# Patient Record
Sex: Female | Born: 1960 | Race: Black or African American | Hispanic: No | Smoking: Never smoker
Health system: Southern US, Community
[De-identification: ages and names within clinical notes are randomized; demographics above are authoritative.]

## PROBLEM LIST (undated history)

## (undated) DIAGNOSIS — K219 Gastro-esophageal reflux disease without esophagitis: Secondary | ICD-10-CM

## (undated) DIAGNOSIS — I10 Essential (primary) hypertension: Secondary | ICD-10-CM

## (undated) DIAGNOSIS — J45909 Unspecified asthma, uncomplicated: Secondary | ICD-10-CM

---

## 2007-06-27 ENCOUNTER — Emergency Department: Payer: Self-pay | Admitting: Emergency Medicine

## 2008-12-20 DIAGNOSIS — F411 Generalized anxiety disorder: Secondary | ICD-10-CM | POA: Insufficient documentation

## 2008-12-28 ENCOUNTER — Ambulatory Visit: Payer: Self-pay

## 2009-08-25 DIAGNOSIS — I10 Essential (primary) hypertension: Secondary | ICD-10-CM | POA: Insufficient documentation

## 2011-05-01 ENCOUNTER — Emergency Department: Payer: Self-pay | Admitting: Emergency Medicine

## 2011-08-31 DIAGNOSIS — K219 Gastro-esophageal reflux disease without esophagitis: Secondary | ICD-10-CM | POA: Insufficient documentation

## 2012-05-24 ENCOUNTER — Emergency Department: Payer: Self-pay | Admitting: Emergency Medicine

## 2012-05-24 LAB — COMPREHENSIVE METABOLIC PANEL
Albumin: 4.1 g/dL (ref 3.4–5.0)
Anion Gap: 4 — ABNORMAL LOW (ref 7–16)
BUN: 20 mg/dL — ABNORMAL HIGH (ref 7–18)
Chloride: 106 mmol/L (ref 98–107)
Co2: 29 mmol/L (ref 21–32)
EGFR (African American): 58 — ABNORMAL LOW
Glucose: 98 mg/dL (ref 65–99)
Osmolality: 280 (ref 275–301)
Potassium: 3.3 mmol/L — ABNORMAL LOW (ref 3.5–5.1)
SGOT(AST): 34 U/L (ref 15–37)
SGPT (ALT): 36 U/L (ref 12–78)
Sodium: 139 mmol/L (ref 136–145)
Total Protein: 8.4 g/dL — ABNORMAL HIGH (ref 6.4–8.2)

## 2012-05-24 LAB — CBC
MCHC: 35.7 g/dL (ref 32.0–36.0)
MCV: 91 fL (ref 80–100)
Platelet: 270 10*3/uL (ref 150–440)
RDW: 13.6 % (ref 11.5–14.5)
WBC: 5.5 10*3/uL (ref 3.6–11.0)

## 2012-08-14 ENCOUNTER — Emergency Department: Payer: Self-pay | Admitting: Emergency Medicine

## 2012-08-14 LAB — CBC
HCT: 40.3 % (ref 35.0–47.0)
HGB: 13.3 g/dL (ref 12.0–16.0)
MCH: 30.5 pg (ref 26.0–34.0)
MCHC: 32.8 g/dL (ref 32.0–36.0)
Platelet: 260 10*3/uL (ref 150–440)
RBC: 4.35 10*6/uL (ref 3.80–5.20)
WBC: 4.3 10*3/uL (ref 3.6–11.0)

## 2012-08-14 LAB — BASIC METABOLIC PANEL
Anion Gap: 7 (ref 7–16)
BUN: 23 mg/dL — ABNORMAL HIGH (ref 7–18)
Chloride: 105 mmol/L (ref 98–107)
Co2: 27 mmol/L (ref 21–32)
Creatinine: 1.16 mg/dL (ref 0.60–1.30)
Osmolality: 281 (ref 275–301)
Potassium: 3 mmol/L — ABNORMAL LOW (ref 3.5–5.1)
Sodium: 139 mmol/L (ref 136–145)

## 2012-10-01 ENCOUNTER — Ambulatory Visit: Payer: Self-pay

## 2012-12-22 ENCOUNTER — Ambulatory Visit: Payer: Self-pay

## 2013-01-02 ENCOUNTER — Ambulatory Visit: Payer: Self-pay

## 2013-08-21 ENCOUNTER — Ambulatory Visit: Payer: Self-pay | Admitting: Orthopedic Surgery

## 2013-09-02 ENCOUNTER — Ambulatory Visit: Payer: Self-pay | Admitting: Orthopedic Surgery

## 2014-07-11 ENCOUNTER — Emergency Department: Payer: Self-pay | Admitting: Emergency Medicine

## 2017-06-11 ENCOUNTER — Other Ambulatory Visit: Payer: Self-pay

## 2017-06-11 ENCOUNTER — Emergency Department: Payer: BLUE CROSS/BLUE SHIELD

## 2017-06-11 ENCOUNTER — Emergency Department
Admission: EM | Admit: 2017-06-11 | Discharge: 2017-06-12 | Disposition: A | Discharge status: Home / Self Care | Payer: BLUE CROSS/BLUE SHIELD | Attending: Emergency Medicine | Admitting: Emergency Medicine

## 2017-06-11 DIAGNOSIS — R0602 Shortness of breath: Secondary | ICD-10-CM | POA: Diagnosis not present

## 2017-06-11 DIAGNOSIS — R079 Chest pain, unspecified: Secondary | ICD-10-CM | POA: Diagnosis present

## 2017-06-11 DIAGNOSIS — Z79899 Other long term (current) drug therapy: Secondary | ICD-10-CM | POA: Insufficient documentation

## 2017-06-11 LAB — BASIC METABOLIC PANEL
Anion gap: 13 (ref 5–15)
BUN: 20 mg/dL (ref 6–20)
CHLORIDE: 99 mmol/L — AB (ref 101–111)
CO2: 26 mmol/L (ref 22–32)
Calcium: 9.5 mg/dL (ref 8.9–10.3)
Creatinine, Ser: 0.93 mg/dL (ref 0.44–1.00)
GFR calc Af Amer: >60 mL/min (ref >60)
GFR calc non Af Amer: >60 mL/min (ref >60)
GLUCOSE: 115 mg/dL — AB (ref 65–99)
POTASSIUM: 2.4 mmol/L — AB (ref 3.5–5.1)
Sodium: 138 mmol/L (ref 135–145)

## 2017-06-11 LAB — CBC
HEMATOCRIT: 36.5 % (ref 35.0–47.0)
HEMOGLOBIN: 12.3 g/dL (ref 12.0–16.0)
MCH: 30.5 pg (ref 26.0–34.0)
MCHC: 33.8 g/dL (ref 32.0–36.0)
MCV: 90.2 fL (ref 80.0–100.0)
Platelets: 302 10*3/uL (ref 150–440)
RBC: 4.05 MIL/uL (ref 3.80–5.20)
RDW: 13.3 % (ref 11.5–14.5)
WBC: 5.4 10*3/uL (ref 3.6–11.0)

## 2017-06-11 LAB — TROPONIN I: Troponin I: <0.03 ng/mL (ref <0.03)

## 2017-06-11 NOTE — ED Triage Notes (Signed)
Pt comes via [facility] from home with c/o chest pain. Pt denies vomiting and had some nausea this am. Pt states pressure and pain was 5/10. EMS gave pt 1 nitro and [facility] this [facility], pt pain currently 4/10. Pt hypertensive initially with BP 156/102. CBG-122, Pulse-110-124 per EMS. PT A&OX4. Respirations even and unlabored and appears in NAD.

## 2017-06-11 NOTE — ED Notes (Signed)
Patient transported to X-ray 

## 2017-06-12 LAB — TROPONIN I: Troponin I: <0.03 ng/mL (ref <0.03)

## 2017-06-12 MED ORDER — POTASSIUM CHLORIDE 20 MEQ PO PACK
40.0000 meq | PACK | Freq: Once | ORAL | Status: AC
  Administered 2017-06-12: 40 meq via ORAL

## 2017-06-12 MED ORDER — POTASSIUM CHLORIDE CRYS ER 20 MEQ PO TBCR
40.0000 meq | EXTENDED_RELEASE_TABLET | Freq: Once | ORAL | Status: AC
  Administered 2017-06-12: 40 meq via ORAL
  Filled 2017-06-12: qty 2

## 2017-06-12 MED ORDER — POTASSIUM CHLORIDE 20 MEQ/15ML (10%) PO SOLN
40.0000 meq | Freq: Once | ORAL | Status: DC
Start: 2017-06-12 — End: 2017-06-12
  Filled 2017-06-12: qty 30

## 2017-06-12 MED ORDER — POTASSIUM CHLORIDE 10 MEQ/100ML IV SOLN
10.0000 meq | INTRAVENOUS | Status: AC
  Administered 2017-06-12: 10 meq via INTRAVENOUS
  Filled 2017-06-12 (×4): qty 100

## 2017-06-12 MED ORDER — SODIUM CHLORIDE 0.9 % IV BOLUS (SEPSIS)
500.0000 mL | Freq: Once | INTRAVENOUS | Status: AC
Start: 2017-06-12 — End: 2017-06-12
  Administered 2017-06-12: 500 mL via INTRAVENOUS

## 2017-06-12 MED ORDER — POTASSIUM CHLORIDE 20 MEQ PO PACK
PACK | ORAL | Status: AC
Start: 2017-06-12 — End: 2017-06-12
  Administered 2017-06-12: 40 meq
  Filled 2017-06-12: qty 2

## 2017-06-12 NOTE — ED Provider Notes (Signed)
[facility] Emergency Department Provider Note   ____________________________________________   First MD Initiated Contact with Patient 06/11/17 2305     (approximate)  I have reviewed the triage vital signs and the nursing notes.   HISTORY  Chief Complaint Chest Pain    HPI Liam GrahamSusan Freda is a 56 y.o. female who comes into the hospital today with shortness of breath and chest pain.  The patient states this started around 8:00.  The patient was sitting and watching TV when it started.  She states it was initially on the right side of her chest and then moved across the left side.  She reports that it was tightness in her chest.  The pain went away on the right but now it is on the left side.  The patient states that her pain is a 4 out of 10 in intensity.  She denies any nausea vomiting lightheadedness or dizziness.  She has had some shortness of breath and she denies that she has had this before.  The patient states that the pain is not pleuritic and does not get worse when she takes a deep breath.   History reviewed. No pertinent past medical history.  There are no active problems to display for this patient.   History reviewed. No pertinent surgical history.  Prior to Admission medications   Medication Sig Start Date End Date Taking? Authorizing Provider  esomeprazole (NEXIUM) 40 MG capsule Take 40 mg by mouth daily.   Yes [provider]  fexofenadine (ALLEGRA) 180 MG tablet Take 180 mg by mouth daily.   Yes [provider]  fluticasone (FLONASE) 50 MCG/ACT nasal spray Place 1 spray into both nostrils daily.   Yes [provider]  hydrochlorothiazide (HYDRODIURIL) 25 MG tablet Take 25 mg by mouth daily.   Yes [provider]  zolpidem (AMBIEN) 5 MG tablet Take 5 mg by mouth at bedtime.    [provider]    Allergies Patient has no known allergies.  No family history on file.  Social History Social  History   Tobacco Use  . Smoking status: Never Smoker  . Smokeless tobacco: Never Used  Substance Use Topics  . Alcohol use: No    Frequency: Never  . Drug use: No    Review of Systems  Constitutional: No fever/chills Eyes: No visual changes. ENT: No sore throat. Cardiovascular:  chest pain. Respiratory:  shortness of breath. Gastrointestinal: No abdominal pain.  No nausea, no vomiting.  No diarrhea.  No constipation. Genitourinary: Negative for dysuria. Musculoskeletal: Negative for back pain. Skin: Negative for rash. Neurological: Negative for headaches, focal weakness or numbness.   ____________________________________________   PHYSICAL EXAM:  VITAL SIGNS: ED Triage Vitals  Enc Vitals Group     BP 06/11/17 2314 132/88     Pulse Rate 06/11/17 2314 (!) 101     Resp 06/11/17 2314 16     Temp 06/11/17 2314 (!) 97.5 F (36.4 C)     Temp Source 06/11/17 2314 Oral     SpO2 06/11/17 2314 100 %     Weight 06/11/17 2314 98 lb (44.5 kg)     Height 06/11/17 2314 5\' 3"  (1.6 m)     Head Circumference --      Peak Flow --      Pain Score 06/11/17 2313 4     Pain Loc --      Pain Edu? --      Excl. in GC? --  Constitutional: Alert and oriented. Well appearing and in mild distress. Eyes: Conjunctivae are normal. PERRL. EOMI. Head: Atraumatic. Nose: No congestion/rhinnorhea. Mouth/Throat: Mucous membranes are moist.  Oropharynx non-erythematous. Cardiovascular: Normal rate, regular rhythm. Grossly normal heart sounds.  Good peripheral circulation. Respiratory: Normal respiratory effort.  No retractions. Lungs CTAB. Left chest with some mild tenderness to palpation Gastrointestinal: Soft and nontender. No distention.  Positive bowel sounds Musculoskeletal: No lower extremity tenderness nor edema.   Neurologic:  Normal speech and language.  Skin:  Skin is warm, dry and intact.  Psychiatric: Mood and affect are normal.   ____________________________________________     LABS (all labs ordered are listed, but only abnormal results are displayed)  Labs Reviewed  BASIC METABOLIC PANEL - Abnormal; Notable for the following components:      Result Value   Potassium 2.4 (*)    Chloride 99 (*)    Glucose, Bld 115 (*)    All other components within normal limits  CBC  TROPONIN I  TROPONIN I   ____________________________________________  EKG  ED ECG REPORT I, Rebecka ApleyWebster,  Allison P, the attending physician, personally viewed and interpreted this ECG.   Date: 06/11/2017  EKG Time: 2353  Rate: 107  Rhythm: sinus tachycardia  Axis: normal  Intervals:none  ST&T Change: none  ____________________________________________  RADIOLOGY  Dg Chest 2 View  Result Date: 06/12/2017 CLINICAL DATA:  56 y/o  F; chest pain and shortness of breath. EXAM: CHEST  2 VIEW COMPARISON:  07/11/2014 chest radiograph FINDINGS: Stable normal cardiac silhouette. Stable lung hyperinflation. Clear lungs. No pleural effusion or pneumothorax. No acute osseous abnormality is evident. IMPRESSION: No acute pulmonary process identified. Electronically Signed   By: Mitzi HansenLance  Furusawa-Stratton M.D.   On: 06/12/2017 00:47    ____________________________________________   PROCEDURES  Procedure(s) performed: None  Procedures  Critical Care performed: No  ____________________________________________   INITIAL IMPRESSION / ASSESSMENT AND PLAN / ED COURSE  As part of my medical decision making, I reviewed the following data within the electronic MEDICAL RECORD NUMBER Notes from prior ED visits and [place] Controlled Substance Database   This is a 56 year old female who comes into the hospital today with chest pain or shortness of breath.  My differential diagnosis includes ACS, musculoskeletal chest pain, pneumonia  We did check some blood work and the patient's blood work was unremarkable aside from low potassium.  The patient did receive a dose of Klor-Con as well as 4 doses of  potassium chloride.  The patient also received a normal saline bolus.  The patient's chest x-ray was unremarkable and she had 2 troponins which were negative.  The patient will be discharged home to follow-up.      ____________________________________________   FINAL CLINICAL IMPRESSION(S) / ED DIAGNOSES  Final diagnoses:  Chest pain, unspecified type  Shortness of breath     ED Discharge Orders    None       Note:  This document was prepared using Dragon voice recognition software and may include unintentional dictation errors.     Rebecka ApleyWebster, Allison P, MD 06/12/17 [id number]

## 2017-06-12 NOTE — Discharge Instructions (Signed)
Please follow up with your primary care physician for further evaluation of your pain and SOB.

## 2017-06-12 NOTE — ED Notes (Signed)
Pt up to toilet 

## 2017-06-12 NOTE — ED Notes (Signed)
Date and time results received: 06/12/17 2340 (use smartphrase ".now" to insert current time)  Test: potassium Critical Value: 2.4  Name of Provider Notified: webster  Orders Received? Or Actions Taken?: Orders Received - See Orders for details

## 2017-06-12 NOTE — ED Notes (Signed)
Pt given ice and pillow. Pt resting comfortably at this time.

## 2017-06-13 ENCOUNTER — Observation Stay
Admission: EM | Admit: 2017-06-13 | Discharge: 2017-06-14 | Disposition: A | Discharge status: Home / Self Care | Payer: BLUE CROSS/BLUE SHIELD | Attending: Internal Medicine | Admitting: Internal Medicine

## 2017-06-13 ENCOUNTER — Other Ambulatory Visit: Payer: Self-pay

## 2017-06-13 ENCOUNTER — Emergency Department: Payer: BLUE CROSS/BLUE SHIELD

## 2017-06-13 DIAGNOSIS — I10 Essential (primary) hypertension: Secondary | ICD-10-CM | POA: Insufficient documentation

## 2017-06-13 DIAGNOSIS — K219 Gastro-esophageal reflux disease without esophagitis: Secondary | ICD-10-CM | POA: Diagnosis not present

## 2017-06-13 DIAGNOSIS — E876 Hypokalemia: Secondary | ICD-10-CM | POA: Diagnosis present

## 2017-06-13 DIAGNOSIS — J45909 Unspecified asthma, uncomplicated: Secondary | ICD-10-CM | POA: Diagnosis not present

## 2017-06-13 DIAGNOSIS — R0789 Other chest pain: Secondary | ICD-10-CM | POA: Diagnosis not present

## 2017-06-13 DIAGNOSIS — R079 Chest pain, unspecified: Secondary | ICD-10-CM | POA: Diagnosis present

## 2017-06-13 DIAGNOSIS — Z8249 Family history of ischemic heart disease and other diseases of the circulatory system: Secondary | ICD-10-CM | POA: Diagnosis not present

## 2017-06-13 DIAGNOSIS — Z7951 Long term (current) use of inhaled steroids: Secondary | ICD-10-CM | POA: Insufficient documentation

## 2017-06-13 HISTORY — DX: Essential (primary) hypertension: I10

## 2017-06-13 HISTORY — DX: Unspecified asthma, uncomplicated: J45.909

## 2017-06-13 HISTORY — DX: Gastro-esophageal reflux disease without esophagitis: K21.9

## 2017-06-13 LAB — CBC
HEMATOCRIT: 39.3 % (ref 35.0–47.0)
Hemoglobin: 12.9 g/dL (ref 12.0–16.0)
MCH: 29.8 pg (ref 26.0–34.0)
MCHC: 32.9 g/dL (ref 32.0–36.0)
MCV: 90.4 fL (ref 80.0–100.0)
Platelets: 312 10*3/uL (ref 150–440)
RBC: 4.34 MIL/uL (ref 3.80–5.20)
RDW: 13.3 % (ref 11.5–14.5)
WBC: 6 10*3/uL (ref 3.6–11.0)

## 2017-06-13 LAB — BASIC METABOLIC PANEL
ANION GAP: 13 (ref 5–15)
BUN: 17 mg/dL (ref 6–20)
CALCIUM: 9.6 mg/dL (ref 8.9–10.3)
CO2: 24 mmol/L (ref 22–32)
Chloride: 98 mmol/L — ABNORMAL LOW (ref 101–111)
Creatinine, Ser: 0.94 mg/dL (ref 0.44–1.00)
GFR calc Af Amer: >60 mL/min (ref >60)
GLUCOSE: 129 mg/dL — AB (ref 65–99)
POTASSIUM: 2.6 mmol/L — AB (ref 3.5–5.1)
SODIUM: 135 mmol/L (ref 135–145)

## 2017-06-13 LAB — TROPONIN I

## 2017-06-13 NOTE — ED Triage Notes (Signed)
Patient c/o rapid heart rate and chest pain. Patient reports concurrent symptoms of SOB, light-headedness.

## 2017-06-13 NOTE — ED Notes (Signed)
Pt uprite on stretcher in exam room with no distress noted; pt reports seen here 12/4 and dx with hypokalemia; received replacement; today began having sensation of heart racing with left sided CP; pt denies any c/o at present; st takes HCTZ and her PCP called her to set up f/u appt for next Tuesday; resp even/unlab, lungs clear, apical audible & regular, +BS, abd soft/nondist/nontender; placed on card monitor

## 2017-06-13 NOTE — ED Provider Notes (Signed)
[facility] Emergency Department Provider Note  ____________________________________________   First MD Initiated Contact with Patient 06/13/17 2331     (approximate)  I have reviewed the triage vital signs and the nursing notes.   HISTORY  Chief Complaint Chest Pain    HPI Lorraine Franklin is a 56 y.o. female whose medical history most notably includes hypertension and a family history of an MI (her mother) who presents for evaluation of acute onset chest pain and palpitations.  This occurred a couple of hours prior to arrival and the episode lasted about 15 minutes.  She describes the pain as sharp.  Nothing in particular makes it better or worse.  He was seen in this emergency department less than 24 hours ago and diagnosed with significant hypokalemia of 2.4.  She was given both oral and IV replacement, a total of 80 mEq split evenly between the 2 routes, and she had a generally reassuring workup.  She presents tonight for recurrence of the symptoms.  She reports that she does not have a cardiologist and no cardiac history.  She denies ever having symptoms like this in the past.  She denies shortness of breath, nausea, vomiting, and abdominal pain.  She has had no recent surgeries or immobilizations or been on long trips.  There is no history of blood clot in her legs or lungs or blood clots in the family.  She has had no leg pain or swelling recently.  Past Medical History:  Diagnosis Date  . Asthma   . GERD (gastroesophageal reflux disease)   . Hypertension     Patient Active Problem List   Diagnosis Date Noted  . Chest pain 06/14/2017    History reviewed. No pertinent surgical history.  Prior to Admission medications   Medication Sig Start Date End Date Taking? Authorizing Provider  esomeprazole (NEXIUM) 40 MG capsule Take 40 mg by mouth daily.   Yes [provider]  fexofenadine (ALLEGRA) 180 MG tablet Take 180 mg by mouth daily.   Yes  [provider]  fluticasone (FLONASE) 50 MCG/ACT nasal spray Place 1 spray into both nostrils daily.   Yes [provider]  hydrochlorothiazide (HYDRODIURIL) 25 MG tablet Take 25 mg by mouth daily.   Yes [provider]  zolpidem (AMBIEN) 5 MG tablet Take 5 mg by mouth at bedtime.   Yes [provider]    Allergies Patient has no known allergies.  Family History  Problem Relation Age of Onset  . Hypertension Mother     Social History Social History   Tobacco Use  . Smoking status: Never Smoker  . Smokeless tobacco: Never Used  Substance Use Topics  . Alcohol use: No    Frequency: Never  . Drug use: No    Review of Systems Constitutional: No fever/chills Cardiovascular: As above Respiratory: Denies shortness of breath. Gastrointestinal: No abdominal pain.  No nausea, no vomiting.  No diarrhea.  No constipation. Genitourinary: Negative for dysuria. Musculoskeletal: Negative for neck pain.  Negative for back pain. Integumentary: Negative for rash. Neurological: Negative for headaches, focal weakness or numbness.   ____________________________________________   PHYSICAL EXAM:  VITAL SIGNS: ED Triage Vitals  Enc Vitals Group     BP 06/13/17 2158 (!) 145/90     Pulse Rate 06/13/17 2158 (!) 111     Resp 06/13/17 2158 15     Temp 06/13/17 2158 98.1 F (36.7 C)     Temp Source 06/13/17 2158 Oral  SpO2 06/13/17 2158 100 %     Weight 06/13/17 2156 44.5 kg (98 lb)     Height --      Head Circumference --      Peak Flow --      Pain Score 06/13/17 2155 7     Pain Loc --      Pain Edu? --      Excl. in GC? --     Constitutional: Alert and oriented. Well appearing and in no acute distress. Eyes: Conjunctivae are normal.  Head: Atraumatic. Nose: No congestion/rhinnorhea. Mouth/Throat: Mucous membranes are moist. Neck: No stridor.  No meningeal signs.   Cardiovascular: Normal rate, regular rhythm. Good peripheral circulation.  Grossly normal heart sounds. Respiratory: Normal respiratory effort.  No retractions. Lungs CTAB. Gastrointestinal: Soft and nontender. No distention.  Musculoskeletal: No lower extremity tenderness nor edema. No gross deformities of extremities. Neurologic:  Normal speech and language. No gross focal neurologic deficits are appreciated.  Skin:  Skin is warm, dry and intact. No rash noted. Psychiatric: Mood and affect are normal. Speech and behavior are normal.  ____________________________________________   LABS (all labs ordered are listed, but only abnormal results are displayed)  Labs Reviewed  BASIC METABOLIC PANEL - Abnormal; Notable for the following components:      Result Value   Potassium 2.6 (*)    Chloride 98 (*)    Glucose, Bld 129 (*)    All other components within normal limits  FIBRIN DERIVATIVES D-DIMER ([facility] ONLY) - Abnormal; Notable for the following components:   Fibrin derivatives D-dimer ([facility]) 652.77 (*)    All other components within normal limits  LIPID PANEL - Abnormal; Notable for the following components:   LDL Cholesterol 104 (*)    All other components within normal limits  CBC  TROPONIN I  MAGNESIUM  PROTIME-INR  TROPONIN I  HEMOGLOBIN A1C  TROPONIN I  TROPONIN I  HIV ANTIBODY (ROUTINE TESTING)  BASIC METABOLIC PANEL  CBC   ____________________________________________  EKG  ED ECG REPORT I, Loleta Roseory Llesenia Fogal, the attending physician, personally viewed and interpreted this ECG.  Date: 06/13/2017 EKG Time: 21:55 Rate: 113 Rhythm: Sinus tachycardia QRS Axis: normal Intervals: normal ST/T Wave abnormalities: ST depression is present in leads II, 3, aVF, and to a lesser extent in V4 through V6.  T wave inversion is present in lead V3.  AVR has a very slight bit of ST segment elevation Narrative Interpretation: No significant changes from prior but possibly representing ischemia in inferior and lateral  leads   ____________________________________________  RADIOLOGY   Dg Chest 2 View  Result Date: 06/13/2017 CLINICAL DATA:  56 y/o  F; rapid heart rate and chest pain. EXAM: CHEST  2 VIEW COMPARISON:  06/11/2017 chest radiograph FINDINGS: Stable normal cardiac silhouette given projection and technique. Clear lungs. No pleural effusion or pneumothorax. Lumbar dextrocurvature. No acute osseous abnormality is evident. IMPRESSION: No acute pulmonary process identified. Electronically Signed   By: Mitzi HansenLance  Furusawa-Stratton M.D.   On: 06/13/2017 22:41   Ct Angio Chest Pe W/cm &/or Wo Cm  Result Date: 06/14/2017 CLINICAL DATA:  56 year old female with concern for PE. EXAM: CT ANGIOGRAPHY CHEST WITH CONTRAST TECHNIQUE: Multidetector CT imaging of the chest was performed using the standard protocol during bolus administration of intravenous contrast. Multiplanar CT image reconstructions and MIPs were obtained to evaluate the vascular anatomy. CONTRAST:  75mL ISOVUE-370 IOPAMIDOL (ISOVUE-370) INJECTION 76% COMPARISON:  Chest radiograph dated 06/13/2017 FINDINGS: Cardiovascular: Borderline cardiac size. No pericardial effusion. The  thoracic aorta is unremarkable. The origins of the great vessels of the aortic arch appear patent as visualized. There is no CT evidence of pulmonary embolism. Mediastinum/Nodes: No hilar or mediastinal adenopathy. The esophagus and thyroid gland are grossly unremarkable. No mediastinal fluid collection. Lungs/Pleura: The lungs are clear. There is no pleural effusion or pneumothorax. The central airways are patent. Upper Abdomen: Indeterminate subcentimeter right posterior liver subcapsular hypodense nodule. The visualized upper abdomen is otherwise unremarkable. Musculoskeletal: There is a pectus excavatum deformity. No acute osseous pathology. Review of the MIP images confirms the above findings. IMPRESSION: No acute intrathoracic pathology. No CT evidence of pulmonary embolism.  Electronically Signed   By: Elgie CollardArash  Radparvar M.D.   On: 06/14/2017 02:04    ____________________________________________   PROCEDURES  Critical Care performed: No   Procedure(s) performed:   Procedures   ____________________________________________   INITIAL IMPRESSION / ASSESSMENT AND PLAN / ED COURSE  As part of my medical decision making, I reviewed the following data within the electronic MEDICAL RECORD NUMBER Nursing notes reviewed and incorporated, Labs reviewed , EKG interpreted , Old EKG reviewed, Old chart reviewed, Discussed with admitting physician  and Notes from prior ED visits    Differential diagnosis includes, but is not limited to, ACS, aortic dissection, pulmonary embolism, cardiac tamponade, pneumothorax, pneumonia, pericarditis, myocarditis, GI-related causes including esophagitis/gastritis, and musculoskeletal chest wall pain.   I find some nonspecific ST depression on her EKG that, while may be normal for her, could also represent ischemia.  Her symptoms sound most consistent with unstable angina although even the patient pointed out that anxiety could be contributory.  PE is unlikely but worth consideration with a d-dimer in an attempt to rule out PE without imaging, but if it is elevated she would likely benefit from rule out CT scan.  Her potassium remains significantly low in spite of extensive replenishment yesterday.  This may contribute to brief episodes of arrhythmia which could be causing her symptoms.  Given her recurrent chest pain, questionable EKG, persistent hypokalemia, and repeat visit in less than 24 hours for the same symptoms, I think she would benefit chest pain observation in the hospital.  I discussed it with the patient and family and they understand and agree with the plan.  Clinical Course as of Jun 14 442  Fri Jun 14, 2017  0029 Discussed in person with Dr. Elisabeth PigeonVachhani.  He will admit for chest pain observation.  He pointed out that [facility] which [facility], which is reassuring, but she still has clincially relevant hypokalemia, ongoing chest pain, episodic palpitations, and abnormal EKG.  Patient agrees with plan.  [CF]  0052 Slightly elevated, given the unexplained nature of her symptoms and the clean cath within the last year, will rule out PE with CTA chest.  Discussed in person with hospitalist. Fibrin derivatives D-dimer [facility]): (!) 652.77 [CF]  0210 No evidence of PE.  Proceeding with admission plan. CT Angio Chest PE W/Cm &/Or Wo Cm [CF]    Clinical Course User Index [CF] Loleta RoseForbach, Cristin Penaflor, MD    ____________________________________________  FINAL CLINICAL IMPRESSION(S) / ED DIAGNOSES  Final diagnoses:  Chest pain, unspecified type  Hypokalemia     MEDICATIONS GIVEN DURING THIS VISIT:  Medications  potassium chloride 10 mEq in 100 mL IVPB (10 mEq Intravenous New Bag/Given 06/14/17 0429)  pantoprazole (PROTONIX) EC tablet 40 mg (not administered)  loratadine (CLARITIN) tablet 10 mg (not administered)  fluticasone (FLONASE) 50  MCG/ACT nasal spray 1 spray (not administered)  zolpidem (AMBIEN) tablet 5 mg (not administered)  docusate sodium (COLACE) capsule 100 mg (not administered)  heparin injection 5,000 Units (not administered)  nitroGLYCERIN (NITROSTAT) SL tablet 0.4 mg (not administered)  potassium chloride (KLOR-CON) packet 40 mEq (not administered)  magnesium sulfate IVPB 2 g 50 mL (0 g Intravenous Stopped 06/14/17 0105)  iopamidol (ISOVUE-370) 76 % injection 75 mL (75 mLs Intravenous Contrast Given 06/14/17 0118)     ED Discharge Orders    None       Note:  This document was prepared using Dragon voice recognition software and may include unintentional dictation errors.    Loleta Rose, MD 06/14/17 [phone]

## 2017-06-14 ENCOUNTER — Other Ambulatory Visit: Payer: Self-pay

## 2017-06-14 ENCOUNTER — Emergency Department: Payer: BLUE CROSS/BLUE SHIELD

## 2017-06-14 ENCOUNTER — Observation Stay (HOSPITAL_BASED_OUTPATIENT_CLINIC_OR_DEPARTMENT_OTHER)
Admit: 2017-06-14 | Discharge: 2017-06-14 | Disposition: A | Discharge status: Home / Self Care | Payer: BLUE CROSS/BLUE SHIELD | Attending: Internal Medicine | Admitting: Internal Medicine

## 2017-06-14 DIAGNOSIS — R079 Chest pain, unspecified: Secondary | ICD-10-CM

## 2017-06-14 LAB — HEMOGLOBIN A1C
HEMOGLOBIN A1C: 6.2 % — AB (ref 4.8–5.6)
MEAN PLASMA GLUCOSE: 131.24 mg/dL

## 2017-06-14 LAB — LIPID PANEL
CHOLESTEROL: 187 mg/dL (ref 0–200)
HDL: 70 mg/dL (ref >40)
LDL Cholesterol: 104 mg/dL — ABNORMAL HIGH (ref 0–99)
TRIGLYCERIDES: 63 mg/dL (ref <150)
Total CHOL/HDL Ratio: 2.7 RATIO
VLDL: 13 mg/dL (ref 0–40)

## 2017-06-14 LAB — CBC
HEMATOCRIT: 37.2 % (ref 35.0–47.0)
Hemoglobin: 12.5 g/dL (ref 12.0–16.0)
MCH: 30.7 pg (ref 26.0–34.0)
MCHC: 33.7 g/dL (ref 32.0–36.0)
MCV: 90.9 fL (ref 80.0–100.0)
Platelets: 284 10*3/uL (ref 150–440)
RBC: 4.09 MIL/uL (ref 3.80–5.20)
RDW: 13.3 % (ref 11.5–14.5)
WBC: 6 10*3/uL (ref 3.6–11.0)

## 2017-06-14 LAB — TROPONIN I
Troponin I: <0.03 ng/mL (ref <0.03)
Troponin I: <0.03 ng/mL (ref <0.03)
Troponin I: <0.03 ng/mL (ref <0.03)

## 2017-06-14 LAB — BASIC METABOLIC PANEL
ANION GAP: 6 (ref 5–15)
BUN: 11 mg/dL (ref 6–20)
CHLORIDE: 103 mmol/L (ref 101–111)
CO2: 26 mmol/L (ref 22–32)
Calcium: 9.3 mg/dL (ref 8.9–10.3)
Creatinine, Ser: 0.8 mg/dL (ref 0.44–1.00)
Glucose, Bld: 100 mg/dL — ABNORMAL HIGH (ref 65–99)
POTASSIUM: 4.1 mmol/L (ref 3.5–5.1)
SODIUM: 135 mmol/L (ref 135–145)

## 2017-06-14 LAB — PROTIME-INR
INR: 0.95
Prothrombin Time: 12.6 seconds (ref 11.4–15.2)

## 2017-06-14 LAB — MAGNESIUM: Magnesium: 1.7 mg/dL (ref 1.7–2.4)

## 2017-06-14 LAB — FIBRIN DERIVATIVES D-DIMER (ARMC ONLY): Fibrin derivatives D-dimer (ARMC): 652.77 ng/mL (FEU) — ABNORMAL HIGH (ref 0.00–499.00)

## 2017-06-14 MED ORDER — ENSURE ENLIVE PO LIQD: 237.0000 mL | Freq: Two times a day (BID) | ORAL | Status: DC

## 2017-06-14 MED ORDER — RANITIDINE HCL 150 MG PO TABS: 150.0000 mg | ORAL_TABLET | Freq: Two times a day (BID) | ORAL | 11 refills | Status: DC

## 2017-06-14 MED ORDER — DOCUSATE SODIUM 100 MG PO CAPS
100.0000 mg | ORAL_CAPSULE | Freq: Two times a day (BID) | ORAL | Status: DC | PRN
Start: 2017-06-14 — End: 2017-06-14

## 2017-06-14 MED ORDER — POTASSIUM CHLORIDE CRYS ER 20 MEQ PO TBCR
40.0000 meq | EXTENDED_RELEASE_TABLET | Freq: Two times a day (BID) | ORAL | Status: DC
  Administered 2017-06-14: 40 meq via ORAL
  Filled 2017-06-14: qty 2

## 2017-06-14 MED ORDER — SIMETHICONE 125 MG PO CHEW: 125.0000 mg | CHEWABLE_TABLET | Freq: Four times a day (QID) | ORAL | 0 refills | Status: DC | PRN

## 2017-06-14 MED ORDER — RANITIDINE HCL 150 MG PO TABS: 150.0000 mg | ORAL_TABLET | Freq: Two times a day (BID) | ORAL | 1 refills | Status: DC

## 2017-06-14 MED ORDER — POTASSIUM CHLORIDE 20 MEQ PO PACK
40.0000 meq | PACK | Freq: Two times a day (BID) | ORAL | Status: DC
  Administered 2017-06-14: 40 meq via ORAL
  Filled 2017-06-14: qty 2

## 2017-06-14 MED ORDER — ADULT MULTIVITAMIN W/MINERALS CH
1.0000 | ORAL_TABLET | Freq: Every day | ORAL | Status: DC
  Administered 2017-06-14: 1 via ORAL
  Filled 2017-06-14: qty 1

## 2017-06-14 MED ORDER — PANTOPRAZOLE SODIUM 40 MG PO TBEC
40.0000 mg | DELAYED_RELEASE_TABLET | Freq: Every day | ORAL | Status: DC
  Administered 2017-06-14: 40 mg via ORAL
  Filled 2017-06-14 (×2): qty 1

## 2017-06-14 MED ORDER — IOPAMIDOL (ISOVUE-370) INJECTION 76%
75.0000 mL | Freq: Once | INTRAVENOUS | Status: AC | PRN
  Administered 2017-06-14: 75 mL via INTRAVENOUS

## 2017-06-14 MED ORDER — LORATADINE 10 MG PO TABS
10.0000 mg | ORAL_TABLET | Freq: Every day | ORAL | Status: DC
  Administered 2017-06-14: 10 mg via ORAL
  Filled 2017-06-14: qty 1

## 2017-06-14 MED ORDER — ZOLPIDEM TARTRATE 5 MG PO TABS: 5.0000 mg | ORAL_TABLET | Freq: Every day | ORAL | Status: DC

## 2017-06-14 MED ORDER — FLUTICASONE PROPIONATE 50 MCG/ACT NA SUSP
1.0000 | Freq: Every day | NASAL | Status: DC
  Filled 2017-06-14: qty 16

## 2017-06-14 MED ORDER — HEPARIN SODIUM (PORCINE) 5000 UNIT/ML IJ SOLN: 5000.0000 [IU] | Freq: Three times a day (TID) | INTRAMUSCULAR | Status: DC

## 2017-06-14 MED ORDER — POTASSIUM CHLORIDE 10 MEQ/100ML IV SOLN
10.0000 meq | INTRAVENOUS | Status: AC
  Administered 2017-06-14 (×6): 10 meq via INTRAVENOUS
  Filled 2017-06-14 (×6): qty 100

## 2017-06-14 MED ORDER — ALUM & MAG HYDROXIDE-SIMETH 200-200-20 MG/5ML PO SUSP: 30.0000 mL | Freq: Four times a day (QID) | ORAL | Status: DC | PRN

## 2017-06-14 MED ORDER — NITROGLYCERIN 0.4 MG SL SUBL: 0.4000 mg | SUBLINGUAL_TABLET | SUBLINGUAL | Status: DC | PRN

## 2017-06-14 MED ORDER — MAGNESIUM SULFATE 2 GM/50ML IV SOLN
2.0000 g | Freq: Once | INTRAVENOUS | Status: AC
  Administered 2017-06-14: 2 g via INTRAVENOUS
  Filled 2017-06-14: qty 50

## 2017-06-14 NOTE — Discharge Summary (Signed)
[facility] - [place] at [facility]  Lorraine GrahamSusan Franklin, 56 y.o., DOB 1961-02-23, MRN [id number]. Admission date: 06/13/2017 Discharge Date 06/14/2017 Primary MD [facility] Admitting Physician Altamese DillingVaibhavkumar Vachhani, MD  Admission Diagnosis  Hypokalemia [E87.6] Chest pain, unspecified type [R07.9]  Discharge Diagnosis   Principal Problem: Chest pain noncardiac patient had a cardiac cath in January likely GI related Essential hypertension GERD Asthma       Hospital Course Lorraine GrahamSusan Franklin  is a 56 y.o. female with a known history of Asthma, GERD, Htn- was admitted at [facility] in Jan 2018- negative for CAD as cath was done. 2 days ago had pain again in left chest, going to right side, no associated SOB. Not associated with exertion- came to ER- sent home after 2 negative troponin.  Since her symptoms were very atypical and GI related she is treated with reflux medications. Since symptoms have resolved her troponins been negative.  At this time stable for discharge              Consults  None  Significant Tests:  See full reports for all details     Dg Chest 2 View  Result Date: 06/13/2017 CLINICAL DATA:  56 y/o  F; rapid heart rate and chest pain. EXAM: CHEST  2 VIEW COMPARISON:  06/11/2017 chest radiograph FINDINGS: Stable normal cardiac silhouette given projection and technique. Clear lungs. No pleural effusion or pneumothorax. Lumbar dextrocurvature. No acute osseous abnormality is evident. IMPRESSION: No acute pulmonary process identified. Electronically Signed   By: Mitzi HansenLance  Furusawa-Stratton M.D.   On: 06/13/2017 22:41   Dg Chest 2 View  Result Date: 06/12/2017 CLINICAL DATA:  56 y/o  F; chest pain and shortness of breath. EXAM: CHEST  2 VIEW COMPARISON:  07/11/2014 chest radiograph FINDINGS: Stable normal cardiac silhouette. Stable lung hyperinflation. Clear lungs. No pleural effusion or pneumothorax. No acute osseous abnormality is  evident. IMPRESSION: No acute pulmonary process identified. Electronically Signed   By: Mitzi HansenLance  Furusawa-Stratton M.D.   On: 06/12/2017 00:47   Ct Angio Chest Pe W/cm &/or Wo Cm  Result Date: 06/14/2017 CLINICAL DATA:  56 year old female with concern for PE. EXAM: CT ANGIOGRAPHY CHEST WITH CONTRAST TECHNIQUE: Multidetector CT imaging of the chest was performed using the standard protocol during bolus administration of intravenous contrast. Multiplanar CT image reconstructions and MIPs were obtained to evaluate the vascular anatomy. CONTRAST:  75mL ISOVUE-370 IOPAMIDOL (ISOVUE-370) INJECTION 76% COMPARISON:  Chest radiograph dated 06/13/2017 FINDINGS: Cardiovascular: Borderline cardiac size. No pericardial effusion. The thoracic aorta is unremarkable. The origins of the great vessels of the aortic arch appear patent as visualized. There is no CT evidence of pulmonary embolism. Mediastinum/Nodes: No hilar or mediastinal adenopathy. The esophagus and thyroid gland are grossly unremarkable. No mediastinal fluid collection. Lungs/Pleura: The lungs are clear. There is no pleural effusion or pneumothorax. The central airways are patent. Upper Abdomen: Indeterminate subcentimeter right posterior liver subcapsular hypodense nodule. The visualized upper abdomen is otherwise unremarkable. Musculoskeletal: There is a pectus excavatum deformity. No acute osseous pathology. Review of the MIP images confirms the above findings. IMPRESSION: No acute intrathoracic pathology. No CT evidence of pulmonary embolism. Electronically Signed   By: Elgie CollardArash  Radparvar M.D.   On: 06/14/2017 02:04       Today   Subjective:   Lorraine GrahamSusan Franklin patient denies no chest pain Objective:   Blood pressure 138/84, pulse 85, temperature 97.6 F (36.4 C), temperature source Oral, resp. rate 18, weight 96 lb 6.4 oz (43.7 kg),  SpO2 96 %.  .  Intake/Output Summary (Last 24 hours) at 06/14/2017 1723 Last data filed at 06/14/2017 1335 Gross per 24  hour  Intake 910 ml  Output 1325 ml  Net -415 ml    Exam VITAL SIGNS: Blood pressure 138/84, pulse 85, temperature 97.6 F (36.4 C), temperature source Oral, resp. rate 18, weight 96 lb 6.4 oz (43.7 kg), SpO2 96 %.  GENERAL:  56 y.o.-year-old patient lying in the bed with no acute distress.  EYES: Pupils equal, round, reactive to light and accommodation. No scleral icterus. Extraocular muscles intact.  HEENT: Head atraumatic, normocephalic. Oropharynx and nasopharynx clear.  NECK:  Supple, no jugular venous distention. No thyroid enlargement, no tenderness.  LUNGS: Normal breath sounds bilaterally, no wheezing, rales,rhonchi or crepitation. No use of accessory muscles of respiration.  CARDIOVASCULAR: S1, S2 normal. No murmurs, rubs, or gallops.  ABDOMEN: Soft, nontender, nondistended. Bowel sounds present. No organomegaly or mass.  EXTREMITIES: No pedal edema, cyanosis, or clubbing.  NEUROLOGIC: Cranial nerves II through XII are intact. Muscle strength 5/5 in all extremities. Sensation intact. Gait not checked.  PSYCHIATRIC: The patient is alert and oriented x 3.  SKIN: No obvious rash, lesion, or ulcer.   Data Review     CBC w Diff:  Lab Results  Component Value Date   WBC 6.0 06/14/2017   HGB 12.5 06/14/2017   HGB 13.3 08/14/2012   HCT 37.2 06/14/2017   HCT 40.3 08/14/2012   PLT 284 06/14/2017   PLT 260 08/14/2012   CMP:  Lab Results  Component Value Date   NA 135 06/14/2017   NA 139 08/14/2012   K 4.1 06/14/2017   K 3.0 (L) 08/14/2012   CL 103 06/14/2017   CL 105 08/14/2012   CO2 26 06/14/2017   CO2 27 08/14/2012   BUN 11 06/14/2017   BUN 23 (H) 08/14/2012   CREATININE 0.80 06/14/2017   CREATININE 1.16 08/14/2012   PROT 8.4 (H) 05/24/2012   ALBUMIN 4.1 05/24/2012   BILITOT 0.2 05/24/2012   ALKPHOS 67 05/24/2012   AST 34 05/24/2012   ALT 36 05/24/2012  .  Micro Results No results found for this or any previous visit (from the past 240 hour(s)).       Code Status Orders  (From admission, onward)        Start     Ordered   06/14/17 0228  Full code  Continuous     06/14/17 0227    Code Status History    Date Active Date Inactive Code Status Order ID Comments User Context   This patient has a current code status but no historical code status.          Follow-up Information    [facility] On 06/18/2017.   Specialty:  General Practice Why:  Appointment Time: 11:20am Contact information: [address] [place] [address] [phone]           Discharge Medications   Allergies as of 06/14/2017   No Known Allergies     Medication List    TAKE these medications   esomeprazole 40 MG capsule Commonly known as:  NEXIUM Take 40 mg by mouth daily.   fexofenadine 180 MG tablet Commonly known as:  ALLEGRA Take 180 mg by mouth daily.   fluticasone 50 MCG/ACT nasal spray Commonly known as:  FLONASE Place 1 spray into both nostrils daily.   hydrochlorothiazide 25 MG tablet Commonly known as:  HYDRODIURIL Take  25 mg by mouth daily.   ranitidine 150 MG tablet Commonly known as:  ZANTAC Take 1 tablet (150 mg total) by mouth 2 (two) times daily.   simethicone 125 MG chewable tablet Commonly known as:  MYLICON Chew 1 tablet (125 mg total) by mouth every 6 (six) hours as needed for flatulence.   zolpidem 5 MG tablet Commonly known as:  AMBIEN Take 5 mg by mouth at bedtime.          Total Time in preparing paper work, data evaluation and todays exam - 35 minutes  Auburn Bilberry M.D on 06/14/2017 at 5:23 PM  [facility]   Office  [phone]

## 2017-06-14 NOTE — Progress Notes (Signed)
*  PRELIMINARY RESULTS* Echocardiogram 2D Echocardiogram has been performed.  Cristela BlueHege, Patrese Neal 06/14/2017, 3:07 PM

## 2017-06-14 NOTE — Progress Notes (Signed)
Discharge instructions explained to pt/ verbalized an understanding/ iv and tele removed/ will transport off unit via wheelchair.  

## 2017-06-14 NOTE — ED Notes (Signed)
Pt to CT via stretcher accomp by CT tech 

## 2017-06-14 NOTE — Progress Notes (Signed)
Initial Nutrition Assessment  DOCUMENTATION CODES:   Non-severe (moderate) malnutrition in context of chronic illness  INTERVENTION:   Ensure Enlive po BID, each supplement provides 350 kcal and 20 grams of protein  MVI daily   NUTRITION DIAGNOSIS:   Moderate Malnutrition related to (unknown ) as evidenced by moderate to severe fat depletions, moderate to severe muscle depletions.  GOAL:   Patient will meet greater than or equal to 90% of their needs  MONITOR:   PO intake, Supplement acceptance, Labs, Weight trends, I & O's  REASON FOR ASSESSMENT:   Other (Comment)(low BMI)    ASSESSMENT:   56 y.o. female with a known history of Asthma, GERD, Htn- was admitted at [facility] in Jan 2018- negative for CAD as cath was done admitted with chest pain    Met with pt in room today. Pt reports good appetite and oral intake pta. Pt ate <25% of her breakfast this morning. Pt reports that her appetite is good, she just does not like the food. Pt is thin and underweight; per pt report, she has always been this size. Per chart, pt has been weight stable since January. Pt reports that she does not drink any supplements at home. RD recommend daily Ensure to help preserve lean muscle mass. Pt is willing to try vanilla Ensure; RD will order.    Medications reviewed and include: heparin, protonix, KCl  Labs reviewed:   Nutrition-Focused physical exam completed. Findings are moderate to severe fat depletions in arms and chest, moderate to severe muscle depletions over entire body, and no edema.   Diet Order:  Diet 2 gram sodium Room service appropriate? Yes; Fluid consistency: Thin  EDUCATION NEEDS:   Education needs have been addressed  Skin: Reviewed RN Assessment  Last BM:  12/6  Height:   Ht Readings from Last 1 Encounters:  06/11/17 _0  (1.6 m)    Weight:   Wt Readings from Last 1 Encounters:  06/14/17 96 lb 6.4 oz (43.7 kg)    Ideal Body Weight:  52.27 kg  BMI:  Body  mass index is 17.08 kg/m.  Estimated Nutritional Needs:   Kcal:  1300-1500kcal/day   Protein:  65-74g/day   Fluid:  >1.3L/day   Koleen Distance MS, RD, LDN Pager #[phone] After Hours Pager: [phone]

## 2017-06-14 NOTE — H&P (Signed)
[facility] - [place] [facility]   PATIENT NAME: Lorraine Franklin    MR#:  [id number]  DATE OF BIRTH:  1960/12/07  DATE OF ADMISSION:  06/13/2017  PRIMARY CARE PHYSICIAN: [facility]   REQUESTING/REFERRING PHYSICIAN: York CeriseForbach  CHIEF COMPLAINT:   Chief Complaint  Patient presents with  . Chest Pain    HISTORY OF PRESENT ILLNESS: Lorraine Franklin  is a 56 y.o. female with a known history of Asthma, GERD, Htn- [facility]- negative for CAD as cath was done. 2 days ago had pain again in left chest, going to right side, no associated SOB. Not associated with exertion- came to ER- sent home after 2 negative troponin. Today again sitting in bed, had pain in chest- pressure like- going to right side, so came back to ER. Noted Low K, and High D Dimer. Ordered CT chest and given for admission.  PAST MEDICAL HISTORY:   Past Medical History:  Diagnosis Date  . Asthma   . GERD (gastroesophageal reflux disease)   . Hypertension     PAST SURGICAL HISTORY: History reviewed. No pertinent surgical history.  SOCIAL HISTORY:  Social History   Tobacco Use  . Smoking status: Never Smoker  . Smokeless tobacco: Never Used  Substance Use Topics  . Alcohol use: No    Frequency: Never    FAMILY HISTORY:  Family History  Problem Relation Age of Onset  . Hypertension Mother     DRUG ALLERGIES: No Known Allergies  REVIEW OF SYSTEMS:   CONSTITUTIONAL: No fever, fatigue or weakness.  EYES: No blurred or double vision.  EARS, NOSE, AND THROAT: No tinnitus or ear pain.  RESPIRATORY: No cough, shortness of breath, wheezing or hemoptysis.  CARDIOVASCULAR: positive for chest pain, orthopnea, edema.  GASTROINTESTINAL: No nausea, vomiting, diarrhea or abdominal pain.  GENITOURINARY: No dysuria, hematuria.  ENDOCRINE: No polyuria, nocturia,  HEMATOLOGY: No anemia, easy bruising or bleeding SKIN: No rash or lesion. MUSCULOSKELETAL: No  joint pain or arthritis.   NEUROLOGIC: No tingling, numbness, weakness.  PSYCHIATRY: No anxiety or depression.   MEDICATIONS AT HOME:  Prior to Admission medications   Medication Sig Start Date End Date Taking? Authorizing Provider  esomeprazole (NEXIUM) 40 MG capsule Take 40 mg by mouth daily.   Yes [provider]  fexofenadine (ALLEGRA) 180 MG tablet Take 180 mg by mouth daily.   Yes [provider]  fluticasone (FLONASE) 50 MCG/ACT nasal spray Place 1 spray into both nostrils daily.   Yes [provider]  hydrochlorothiazide (HYDRODIURIL) 25 MG tablet Take 25 mg by mouth daily.   Yes [provider]  zolpidem (AMBIEN) 5 MG tablet Take 5 mg by mouth at bedtime.   Yes [provider]      PHYSICAL EXAMINATION:   VITAL SIGNS: Blood pressure (!) 139/93, pulse 97, temperature 98.1 F (36.7 C), temperature source Oral, resp. rate 14, weight 44.5 kg (98 lb), SpO2 98 %.  GENERAL:  56 y.o.-year-old thin patient lying in the bed with no acute distress.  EYES: Pupils equal, round, reactive to light and accommodation. No scleral icterus. Extraocular muscles intact.  HEENT: Head atraumatic, normocephalic. Oropharynx and nasopharynx clear.  NECK:  Supple, no jugular venous distention. No thyroid enlargement, no tenderness.  LUNGS: Normal breath sounds bilaterally, no wheezing, rales,rhonchi or crepitation. No use of accessory muscles of respiration.  CARDIOVASCULAR: S1, S2 normal. No murmurs, rubs, or gallops.  ABDOMEN: Soft, nontender, nondistended. Bowel sounds  present. No organomegaly or mass.  EXTREMITIES: No pedal edema, cyanosis, or clubbing.  NEUROLOGIC: Cranial nerves II through XII are intact. Muscle strength 5/5 in all extremities. Sensation intact. Gait not checked.  PSYCHIATRIC: The patient is alert and oriented x 3.  SKIN: No obvious rash, lesion, or ulcer.   LABORATORY PANEL:   CBC Recent Labs  Lab 06/11/17 2310 06/13/17 2154   WBC 5.4 6.0  HGB 12.3 12.9  HCT 36.5 39.3  PLT 302 312  MCV 90.2 90.4  MCH 30.5 29.8  MCHC 33.8 32.9  RDW 13.3 13.3   ------------------------------------------------------------------------------------------------------------------  Chemistries  Recent Labs  Lab 06/11/17 2310 06/13/17 2154 06/14/17 0000  NA 138 135  --   K 2.4* 2.6*  --   CL 99* 98*  --   CO2 26 24  --   GLUCOSE 115* 129*  --   BUN 20 17  --   CREATININE 0.93 0.94  --   CALCIUM 9.5 9.6  --   MG  --   --  1.7   ------------------------------------------------------------------------------------------------------------------ estimated creatinine clearance is 48.1 mL/min (by C-G formula based on SCr of 0.94 mg/dL). ------------------------------------------------------------------------------------------------------------------ No results for input(s): TSH, T4TOTAL, T3FREE, THYROIDAB in the last 72 hours.  Invalid input(s): FREET3   Coagulation profile Recent Labs  Lab 06/14/17 0000  INR 0.95   ------------------------------------------------------------------------------------------------------------------- No results for input(s): DDIMER in the last 72 hours. -------------------------------------------------------------------------------------------------------------------  Cardiac Enzymes Recent Labs  Lab 06/11/17 2310 06/12/17 0201 06/13/17 2154  TROPONINI <0.03 <0.03 <0.03   ------------------------------------------------------------------------------------------------------------------ Invalid input(s): POCBNP  ---------------------------------------------------------------------------------------------------------------  Urinalysis No results found for: COLORURINE, APPEARANCEUR, LABSPEC, PHURINE, GLUCOSEU, HGBUR, BILIRUBINUR, KETONESUR, PROTEINUR, UROBILINOGEN, NITRITE, LEUKOCYTESUR   RADIOLOGY: Dg Chest 2 View  Result Date: 06/13/2017 CLINICAL DATA:  56 y/o  F; rapid heart  rate and chest pain. EXAM: CHEST  2 VIEW COMPARISON:  06/11/2017 chest radiograph FINDINGS: Stable normal cardiac silhouette given projection and technique. Clear lungs. No pleural effusion or pneumothorax. Lumbar dextrocurvature. No acute osseous abnormality is evident. IMPRESSION: No acute pulmonary process identified. Electronically Signed   By: Mitzi HansenLance  Furusawa-Stratton M.D.   On: 06/13/2017 22:41    EKG: Orders placed or performed during the hospital encounter of 06/13/17  . ED EKG within 10 minutes  . ED EKG within 10 minutes    IMPRESSION AND PLAN:  * Chest pain   Monitor on tele, Serial troponin.   Echo.   Follow result of CT angio   Cardio consult.   Lipid and HBA1c  * High D dimer   Follow result on CT angio.  * Hypokalemia   Replace IV and oral    Check Mg  * Htn    Stable/  All the records are reviewed and case discussed with ED provider. Management plans discussed with the patient, family and they are in agreement.  CODE STATUS: Full. Code Status History    This patient does not have a recorded code status. Please follow your organizational policy for patients in this situation.       TOTAL TIME TAKING CARE OF THIS PATIENT: 45 minutes.    Altamese DillingVaibhavkumar Cyani Kallstrom M.D on 06/14/2017   Between 7am to 6pm - Pager - [phone]  After 6pm go to [email or web address] - Social research officer, governmentpassword [email or web address]  [facility] [place] [facility]  Office  [phone]  CC: Primary care physician; [facility]   Note: This dictation was prepared with Nurse, children'sDragon dictation along with smaller phrase technology. Any transcriptional errors that result from this process  are unintentional.

## 2017-06-14 NOTE — Plan of Care (Signed)
  Progressing Education: Knowledge of General Education information will improve 06/14/2017 0353 - Progressing by Dorna LeitzNesbitt, Wrigley Winborne M, RN Pain Managment: General experience of comfort will improve 06/14/2017 0353 - Progressing by Dorna LeitzNesbitt, Halle Davlin M, RN Safety: Ability to remain free from injury will improve 06/14/2017 0353 - Progressing by Dorna LeitzNesbitt, Corabelle Spackman M, RN Cardiac: Ability to achieve and maintain adequate cardiovascular perfusion will improve 06/14/2017 0353 - Progressing by Dorna LeitzNesbitt, Treshon Stannard M, RN

## 2017-06-14 NOTE — ED Notes (Signed)
Dr Elisabeth PigeonVachhani in to see pt

## 2017-06-14 NOTE — Discharge Instructions (Signed)
[facility] - [place] [facility] ° °DIET:  °Cardiac diet ° °DISCHARGE CONDITION:  °Stable ° °ACTIVITY:  °Activity as tolerated ° °OXYGEN:  °Home Oxygen: no °  °Oxygen Delivery: room air ° °DISCHARGE LOCATION:  °home  ° ° °ADDITIONAL DISCHARGE INSTRUCTION: ° ° °If you experience worsening of your admission symptoms, develop shortness of breath, life threatening emergency, suicidal or homicidal thoughts you must seek medical attention immediately by calling 911 or calling your MD immediately  if symptoms less severe. ° °You Must read complete instructions/literature along with all the possible adverse reactions/side effects for all the Medicines you take and that have been prescribed to you. Take any new Medicines after you have completely understood and accpet all the possible adverse reactions/side effects.  ° °Please note ° °You were cared for by a hospitalist during your hospital stay. If you have any questions about your discharge medications or the care you received while you were in the hospital after you are discharged, you can call the unit and asked to speak with the hospitalist on call if the hospitalist that took care of you is not available. Once you are discharged, your primary care physician will handle any further medical issues. Please note that NO REFILLS for any discharge medications will be authorized once you are discharged, as it is imperative that you return to your primary care physician (or establish a relationship with a primary care physician if you do not have one) for your aftercare needs so that they can reassess your need for medications and monitor your lab values. ° ° °

## 2017-06-15 LAB — HIV ANTIBODY (ROUTINE TESTING W REFLEX): HIV SCREEN 4TH GENERATION: NONREACTIVE

## 2017-06-15 LAB — ECHOCARDIOGRAM COMPLETE: Weight: 1542.4 oz

## 2017-07-23 DIAGNOSIS — F5104 Psychophysiologic insomnia: Secondary | ICD-10-CM | POA: Insufficient documentation

## 2017-09-06 ENCOUNTER — Emergency Department
Admission: EM | Admit: 2017-09-06 | Discharge: 2017-09-06 | Disposition: A | Discharge status: Home / Self Care | Payer: BLUE CROSS/BLUE SHIELD | Attending: Emergency Medicine | Admitting: Emergency Medicine

## 2017-09-06 ENCOUNTER — Encounter: Payer: Self-pay | Admitting: Emergency Medicine

## 2017-09-06 ENCOUNTER — Other Ambulatory Visit: Payer: Self-pay

## 2017-09-06 ENCOUNTER — Emergency Department: Payer: BLUE CROSS/BLUE SHIELD

## 2017-09-06 DIAGNOSIS — I10 Essential (primary) hypertension: Secondary | ICD-10-CM | POA: Insufficient documentation

## 2017-09-06 DIAGNOSIS — E876 Hypokalemia: Secondary | ICD-10-CM | POA: Diagnosis not present

## 2017-09-06 DIAGNOSIS — Z79899 Other long term (current) drug therapy: Secondary | ICD-10-CM | POA: Insufficient documentation

## 2017-09-06 DIAGNOSIS — R079 Chest pain, unspecified: Secondary | ICD-10-CM | POA: Diagnosis present

## 2017-09-06 DIAGNOSIS — J45909 Unspecified asthma, uncomplicated: Secondary | ICD-10-CM | POA: Diagnosis not present

## 2017-09-06 LAB — BASIC METABOLIC PANEL
ANION GAP: 9 (ref 5–15)
BUN: 16 mg/dL (ref 6–20)
CALCIUM: 9.7 mg/dL (ref 8.9–10.3)
CO2: 28 mmol/L (ref 22–32)
CREATININE: 0.92 mg/dL (ref 0.44–1.00)
Chloride: 101 mmol/L (ref 101–111)
GFR calc non Af Amer: >60 mL/min (ref >60)
Glucose, Bld: 111 mg/dL — ABNORMAL HIGH (ref 65–99)
Potassium: 3 mmol/L — ABNORMAL LOW (ref 3.5–5.1)
SODIUM: 138 mmol/L (ref 135–145)

## 2017-09-06 LAB — CBC
HCT: 38.4 % (ref 35.0–47.0)
HEMOGLOBIN: 12.9 g/dL (ref 12.0–16.0)
MCH: 30 pg (ref 26.0–34.0)
MCHC: 33.6 g/dL (ref 32.0–36.0)
MCV: 89.5 fL (ref 80.0–100.0)
PLATELETS: 303 10*3/uL (ref 150–440)
RBC: 4.29 MIL/uL (ref 3.80–5.20)
RDW: 12.7 % (ref 11.5–14.5)
WBC: 6.5 10*3/uL (ref 3.6–11.0)

## 2017-09-06 LAB — TROPONIN I

## 2017-09-06 MED ORDER — POTASSIUM CHLORIDE CRYS ER 10 MEQ PO TBCR: 10.0000 meq | EXTENDED_RELEASE_TABLET | Freq: Two times a day (BID) | ORAL | 0 refills | Status: DC

## 2017-09-06 MED ORDER — SODIUM CHLORIDE 0.9 % IV BOLUS (SEPSIS)
1000.0000 mL | Freq: Once | INTRAVENOUS | Status: AC
  Administered 2017-09-06: 1000 mL via INTRAVENOUS

## 2017-09-06 NOTE — ED Provider Notes (Signed)
[facility] Emergency Department Provider Note  Time seen: 7:20 AM  I have reviewed the triage vital signs and the nursing notes.   HISTORY  Chief Complaint Chest Pain    HPI Lorraine GrahamSusan Franklin is a 57 y.o. female with a past medical history of asthma, gastric reflux, hypertension, presents to the emergency department for chest discomfort which occurred last night.  Patient states she had chest discomfort in her right chest that then moved to her left chest and then went away after several minutes.  States she had a similar discomfort back in December when she was diagnosed with hypokalemia, and believes her potassium level is likely low causing her chest pain today.  Patient denies any shortness of breath, nausea or diaphoresis.  Denies any chest pain currently.  Patient states she feels well.  Patient has a largely negative review of systems including recent illness besides mild congestion.  Denies vomiting or diarrhea.   Past Medical History:  Diagnosis Date  . Asthma   . GERD (gastroesophageal reflux disease)   . Hypertension     Patient Active Problem List   Diagnosis Date Noted  . Chest pain 06/14/2017    History reviewed. No pertinent surgical history.  Prior to Admission medications   Medication Sig Start Date End Date Taking? Authorizing Provider  esomeprazole (NEXIUM) 40 MG capsule Take 40 mg by mouth daily.    [provider]  fexofenadine (ALLEGRA) 180 MG tablet Take 180 mg by mouth daily.    [provider]  fluticasone (FLONASE) 50 MCG/ACT nasal spray Place 1 spray into both nostrils daily.    [provider]  hydrochlorothiazide (HYDRODIURIL) 25 MG tablet Take 25 mg by mouth daily.    [provider]  ranitidine (ZANTAC) 150 MG tablet Take 1 tablet (150 mg total) by mouth 2 (two) times daily. 06/14/17 06/14/18  Auburn BilberryPatel, Shreyang, MD  simethicone (MYLICON) 125 MG chewable tablet Chew 1 tablet (125 mg total) by mouth  every 6 (six) hours as needed for flatulence. 06/14/17   Auburn BilberryPatel, Shreyang, MD  zolpidem (AMBIEN) 5 MG tablet Take 5 mg by mouth at bedtime.    [provider]    No Known Allergies  Family History  Problem Relation Age of Onset  . Hypertension Mother     Social History Social History   Tobacco Use  . Smoking status: Never Smoker  . Smokeless tobacco: Never Used  Substance Use Topics  . Alcohol use: No    Frequency: Never  . Drug use: No    Review of Systems Constitutional: Negative for fever. Eyes: Negative for visual complaints ENT: Mild congestion Cardiovascular: Chest pain last night lasting several minutes and then resolving on its own, none since Respiratory: Negative for shortness of breath. Gastrointestinal: Negative for abdominal pain, vomiting and diarrhea. Genitourinary: Negative for urinary compaints Musculoskeletal: Negative for leg pain or swelling Skin: Negative for skin complaints  Neurological: Negative for headache All other ROS negative  ____________________________________________   PHYSICAL EXAM:  VITAL SIGNS: ED Triage Vitals  Enc Vitals Group     BP 09/06/17 0059 (!) 155/91     Pulse Rate 09/06/17 0059 (!) 114     Resp 09/06/17 0514 20     Temp 09/06/17 0059 97.9 F (36.6 C)     Temp Source 09/06/17 0059 Oral     SpO2 09/06/17 0059 100 %     Weight 09/06/17 0056 96 lb (43.5 kg)     Height 09/06/17 0056  5\' 3"  (1.6 m)     Head Circumference --      Peak Flow --      Pain Score 09/06/17 0056 4     Pain Loc --      Pain Edu? --      Excl. in GC? --     Constitutional: Alert and oriented. Well appearing and in no distress.  Thin/cachectic appearance.  Sitting in bed, no acute distress, working on a crossword puzzle. Eyes: Normal exam ENT   Head: Normocephalic and atraumatic.   Mouth/Throat: Mucous membranes are moist. Cardiovascular: Normal rate, regular rhythm. No murmur Respiratory: Normal respiratory effort without  tachypnea nor retractions. Breath sounds are clear  Gastrointestinal: Soft and nontender. No distention.  Musculoskeletal: Nontender with normal range of motion in all extremities.  No lower extremity edema or tenderness. Neurologic:  Normal speech and language. No gross focal neurologic deficits Skin:  Skin is warm, dry and intact.  Psychiatric: Mood and affect are normal.   ____________________________________________    EKG  EKG reviewed and interpreted by myself shows sinus tachycardia 110 bpm with a narrow QRS, normal axis, normal intervals, RSR most consistent with right bundle branch block, nonspecific ST changes but no ST elevation.  ____________________________________________    RADIOLOGY  Chest x-ray is normal  ____________________________________________   INITIAL IMPRESSION / ASSESSMENT AND PLAN / ED COURSE  Pertinent labs & imaging results that were available during my care of the patient were reviewed by me and considered in my medical decision making (see chart for details).  Patient presents to the emergency department for chest discomfort which occurred last night lasting several minutes then resolving on its own.  Patient is concerned that her potassium could be low as she had similar symptoms back in December with low potassium.  Differential would include ACS, chest wall pain, pneumonia, pneumothorax.  Patient's initial lab work is largely nonrevealing, mild hypokalemia with a potassium of 3.0.  Troponin is negative.  X-ray is normal.  EKG shows nonspecific changes.  We will repeat a troponin.  Patient remains mildly tachycardic currently around 100-110 bpm.  We will dose IV fluids.  Patient states no chest pain currently, no shortness of breath at any point, no diaphoresis, no pleuritic nature to her discomfort.  Patient's repeat troponin is negative.  Heart rate has come down nicely with IV fluids.  Currently 85-89 bpm during my exam.  Patient denies any chest  pain trouble breathing.  We will discharge patient home with potassium supplementation have the patient follow-up with her doctor in 1 week for recheck.  Patient agreeable to this plan of care.   ____________________________________________   FINAL CLINICAL IMPRESSION(S) / ED DIAGNOSES  Chest pain Hypokalemia   Minna Antis, MD 09/06/17 [phone]

## 2017-09-06 NOTE — Discharge Instructions (Signed)
You have been seen in the emergency department today for chest pain. Your workup has shown a low potassium.  As we discussed please follow-up with your primary care physician in the next 1-2 days for recheck. Return to the emergency department for any further chest pain, trouble breathing, or any other symptom personally concerning to yourself.  Please take your potassium as prescribed for the next 7 days.  Please follow-up with your doctor after which for recheck of your potassium level.

## 2017-09-06 NOTE — ED Notes (Signed)
Pt states her chest pain has resolved.  

## 2017-09-06 NOTE — ED Notes (Signed)
Pt to STAT desk. Reports she has decided to stay and be seen this morning.

## 2017-09-06 NOTE — ED Triage Notes (Signed)
Patient ambulatory to triage with steady gait, without difficulty or distress noted; pt reports upper CP tonight accomp by some SHOB; st hx of same and dx with hypokalemia

## 2017-09-06 NOTE — ED Notes (Signed)
Pt to STAT desk and states she is wanting to go home now and will call her MD in the morning. States she is feeling much better.

## 2017-09-07 ENCOUNTER — Emergency Department
Admission: EM | Admit: 2017-09-07 | Discharge: 2017-09-07 | Disposition: A | Discharge status: Home / Self Care | Payer: BLUE CROSS/BLUE SHIELD | Attending: Emergency Medicine | Admitting: Emergency Medicine

## 2017-09-07 ENCOUNTER — Encounter: Payer: Self-pay | Admitting: Emergency Medicine

## 2017-09-07 ENCOUNTER — Other Ambulatory Visit: Payer: Self-pay

## 2017-09-07 ENCOUNTER — Emergency Department: Payer: BLUE CROSS/BLUE SHIELD

## 2017-09-07 DIAGNOSIS — J45909 Unspecified asthma, uncomplicated: Secondary | ICD-10-CM | POA: Insufficient documentation

## 2017-09-07 DIAGNOSIS — Z79899 Other long term (current) drug therapy: Secondary | ICD-10-CM | POA: Diagnosis not present

## 2017-09-07 DIAGNOSIS — R002 Palpitations: Secondary | ICD-10-CM | POA: Insufficient documentation

## 2017-09-07 DIAGNOSIS — I10 Essential (primary) hypertension: Secondary | ICD-10-CM | POA: Insufficient documentation

## 2017-09-07 LAB — COMPREHENSIVE METABOLIC PANEL
ALK PHOS: 62 U/L (ref 38–126)
ALT: 16 U/L (ref 14–54)
ANION GAP: 12 (ref 5–15)
AST: 31 U/L (ref 15–41)
Albumin: 4.3 g/dL (ref 3.5–5.0)
BILIRUBIN TOTAL: 0.6 mg/dL (ref 0.3–1.2)
BUN: 19 mg/dL (ref 6–20)
CALCIUM: 9.7 mg/dL (ref 8.9–10.3)
CO2: 26 mmol/L (ref 22–32)
Chloride: 101 mmol/L (ref 101–111)
Creatinine, Ser: 1 mg/dL (ref 0.44–1.00)
GFR calc Af Amer: >60 mL/min (ref >60)
GFR calc non Af Amer: >60 mL/min (ref >60)
Glucose, Bld: 138 mg/dL — ABNORMAL HIGH (ref 65–99)
Potassium: 3 mmol/L — ABNORMAL LOW (ref 3.5–5.1)
SODIUM: 139 mmol/L (ref 135–145)
TOTAL PROTEIN: 8.2 g/dL — AB (ref 6.5–8.1)

## 2017-09-07 LAB — CBC
HCT: 38.3 % (ref 35.0–47.0)
HEMOGLOBIN: 12.7 g/dL (ref 12.0–16.0)
MCH: 29.7 pg (ref 26.0–34.0)
MCHC: 33.1 g/dL (ref 32.0–36.0)
MCV: 89.9 fL (ref 80.0–100.0)
Platelets: 294 10*3/uL (ref 150–440)
RBC: 4.26 MIL/uL (ref 3.80–5.20)
RDW: 13 % (ref 11.5–14.5)
WBC: 5.9 10*3/uL (ref 3.6–11.0)

## 2017-09-07 LAB — TROPONIN I

## 2017-09-07 MED ORDER — SODIUM CHLORIDE 0.9 % IV SOLN
1000.0000 mL | Freq: Once | INTRAVENOUS | Status: AC
  Administered 2017-09-07: 1000 mL via INTRAVENOUS

## 2017-09-07 MED ORDER — IOPAMIDOL (ISOVUE-370) INJECTION 76%
75.0000 mL | Freq: Once | INTRAVENOUS | Status: AC | PRN
  Administered 2017-09-07: 75 mL via INTRAVENOUS

## 2017-09-07 NOTE — ED Provider Notes (Signed)
[facility] Emergency Department Provider Note   ____________________________________________    I have reviewed the triage vital signs and the nursing notes.   HISTORY  Chief Complaint Tachycardia     HPI Lorraine GrahamSusan Duclos is a 57 y.o. female who presents with complaints of intermittent palpitations.  Patient was seen yesterday for similar complaints.  She attributes her palpitations to hypokalemia, was given p.o. potassium but has not taken any at home.  She denies chest pain.  No shortness of breath.  No pleurisy.  No fevers or chills or cough.  Currently feels well and reports palpitations of improved   Past Medical History:  Diagnosis Date  . Asthma   . GERD (gastroesophageal reflux disease)   . Hypertension     Patient Active Problem List   Diagnosis Date Noted  . Chest pain 06/14/2017    History reviewed. No pertinent surgical history.  Prior to Admission medications   Medication Sig Start Date End Date Taking? Authorizing Provider  esomeprazole (NEXIUM) 40 MG capsule Take 40 mg by mouth daily.    [provider]  fexofenadine (ALLEGRA) 180 MG tablet Take 180 mg by mouth daily.    [provider]  fluticasone (FLONASE) 50 MCG/ACT nasal spray Place 1 spray into both nostrils daily.    [provider]  hydrochlorothiazide (HYDRODIURIL) 25 MG tablet Take 25 mg by mouth daily.    [provider]  potassium chloride (K-DUR,KLOR-CON) 10 MEQ tablet Take 1 tablet (10 mEq total) by mouth 2 (two) times daily for 7 days. 09/06/17 09/13/17  Minna AntisPaduchowski, Kevin, MD  ranitidine (ZANTAC) 150 MG tablet Take 1 tablet (150 mg total) by mouth 2 (two) times daily. 06/14/17 06/14/18  Auburn BilberryPatel, Shreyang, MD  simethicone (MYLICON) 125 MG chewable tablet Chew 1 tablet (125 mg total) by mouth every 6 (six) hours as needed for flatulence. 06/14/17   Auburn BilberryPatel, Shreyang, MD  zolpidem (AMBIEN) 5 MG tablet Take 5 mg by mouth at bedtime.    [provider]     Allergies Patient has no known allergies.  Family History  Problem Relation Age of Onset  . Hypertension Mother     Social History Social History   Tobacco Use  . Smoking status: Never Smoker  . Smokeless tobacco: Never Used  Substance Use Topics  . Alcohol use: No    Frequency: Never  . Drug use: No    Review of Systems  Constitutional: No fever/chills Eyes: No visual changes.  ENT: No sore throat. Cardiovascular: As above Respiratory: Denies shortness of breath. Gastrointestinal: No abdominal pain.  No nausea, no vomiting.   Genitourinary: Negative for dysuria. Musculoskeletal: Negative for joint swelling Skin: Negative for rash. Neurological: Negative for headaches    ____________________________________________   PHYSICAL EXAM:  VITAL SIGNS: ED Triage Vitals  Enc Vitals Group     BP 09/07/17 2019 (!) 154/82     Pulse Rate 09/07/17 2019 (!) 117     Resp 09/07/17 2019 16     Temp 09/07/17 2019 98.8 F (37.1 C)     Temp Source 09/07/17 2019 Oral     SpO2 09/07/17 2019 100 %     Weight 09/07/17 2022 43.5 kg (96 lb)     Height 09/07/17 2022 1.6 m (5\' 3" )     Head Circumference --      Peak Flow --      Pain Score 09/07/17 2021 0     Pain Loc --  Pain Edu? --      Excl. in GC? --     Constitutional: Alert and oriented. No acute distress. Pleasant and interactive Eyes: Conjunctivae are normal.  Head: Atraumatic. Nose: No congestion/rhinnorhea. Mouth/Throat: Mucous membranes are moist.   Neck:  Painless ROM Cardiovascular: Tachycardia, regular rhythm. Grossly normal heart sounds.  Good peripheral circulation. Respiratory: Normal respiratory effort.  No retractions. Lungs CTAB. Gastrointestinal: Soft and nontender. No distention.  No CVA tenderness. Genitourinary: deferred Musculoskeletal: No lower extremity tenderness nor edema.  Warm and well perfused Neurologic:  Normal speech and language. No gross focal neurologic  deficits are appreciated.  Skin:  Skin is warm, dry and intact. No rash noted. Psychiatric: Mood and affect are normal. Speech and behavior are normal.  ____________________________________________   LABS (all labs ordered are listed, but only abnormal results are displayed)  Labs Reviewed  COMPREHENSIVE METABOLIC PANEL - Abnormal; Notable for the following components:      Result Value   Potassium 3.0 (*)    Glucose, Bld 138 (*)    Total Protein 8.2 (*)    All other components within normal limits  CBC  TROPONIN I   ____________________________________________  EKG  ED ECG REPORT I, Jene Every, the attending physician, personally viewed and interpreted this ECG.  Date: 09/07/2017  Rhythm: Tachycardia QRS Axis: normal Intervals: normal ST/T Wave abnormalities: normal Narrative Interpretation: no evidence of acute ischemia  ____________________________________________  RADIOLOGY  CT angiography negative for PE ____________________________________________   PROCEDURES  Procedure(s) performed: No  Procedures   Critical Care performed: No ____________________________________________   INITIAL IMPRESSION / ASSESSMENT AND PLAN / ED COURSE  Pertinent labs & imaging results that were available during my care of the patient were reviewed by me and considered in my medical decision making (see chart for details).  Patient well-appearing in no acute distress, exam significant only for mild tachycardia.  We will check labs, give IV fluids and obtain CT angiography given what appears to be somewhat persistent tachycardia.  ----------------------------------------- 11:34 PM on 09/07/2017 -----------------------------------------  Heart rate improved significantly with IV fluids as it did yesterday.  CT angiography negative for PE or dissection or pneumonia.  We will have the patient follow-up with cardiology for Holter monitoring/further evaluation      ____________________________________________   FINAL CLINICAL IMPRESSION(S) / ED DIAGNOSES  Final diagnoses:  Palpitations        Note:  This document was prepared using Dragon voice recognition software and may include unintentional dictation errors.    Jene Every, MD 09/07/17 [phone]

## 2017-09-07 NOTE — ED Triage Notes (Signed)
Pt came in for elevated heart rate., hr 145 in triage, seen for same last night and was told her potassium was low. Given rx for potassium and states she took some today.

## 2018-01-07 ENCOUNTER — Other Ambulatory Visit: Payer: Self-pay | Admitting: Physician Assistant

## 2018-01-07 DIAGNOSIS — M5412 Radiculopathy, cervical region: Secondary | ICD-10-CM

## 2018-03-08 ENCOUNTER — Emergency Department: Payer: BLUE CROSS/BLUE SHIELD

## 2018-03-08 ENCOUNTER — Other Ambulatory Visit: Payer: Self-pay

## 2018-03-08 DIAGNOSIS — R000 Tachycardia, unspecified: Secondary | ICD-10-CM | POA: Insufficient documentation

## 2018-03-08 DIAGNOSIS — I10 Essential (primary) hypertension: Secondary | ICD-10-CM | POA: Insufficient documentation

## 2018-03-08 DIAGNOSIS — Z79899 Other long term (current) drug therapy: Secondary | ICD-10-CM | POA: Insufficient documentation

## 2018-03-08 DIAGNOSIS — R002 Palpitations: Secondary | ICD-10-CM | POA: Diagnosis present

## 2018-03-08 DIAGNOSIS — E876 Hypokalemia: Secondary | ICD-10-CM | POA: Insufficient documentation

## 2018-03-08 DIAGNOSIS — E860 Dehydration: Secondary | ICD-10-CM | POA: Insufficient documentation

## 2018-03-08 DIAGNOSIS — J45909 Unspecified asthma, uncomplicated: Secondary | ICD-10-CM | POA: Insufficient documentation

## 2018-03-08 LAB — CBC
HEMATOCRIT: 36.8 % (ref 35.0–47.0)
Hemoglobin: 12.9 g/dL (ref 12.0–16.0)
MCH: 30.4 pg (ref 26.0–34.0)
MCHC: 35.1 g/dL (ref 32.0–36.0)
MCV: 86.8 fL (ref 80.0–100.0)
Platelets: 292 10*3/uL (ref 150–440)
RBC: 4.24 MIL/uL (ref 3.80–5.20)
RDW: 13.3 % (ref 11.5–14.5)
WBC: 6.4 10*3/uL (ref 3.6–11.0)

## 2018-03-08 NOTE — ED Triage Notes (Signed)
Reports felt like her heart was racing at work, went home and tried to rest but couldn't relax.

## 2018-03-09 ENCOUNTER — Emergency Department
Admission: EM | Admit: 2018-03-09 | Discharge: 2018-03-09 | Disposition: A | Discharge status: Home / Self Care | Payer: BLUE CROSS/BLUE SHIELD | Attending: Emergency Medicine | Admitting: Emergency Medicine

## 2018-03-09 DIAGNOSIS — R000 Tachycardia, unspecified: Secondary | ICD-10-CM

## 2018-03-09 DIAGNOSIS — E860 Dehydration: Secondary | ICD-10-CM

## 2018-03-09 DIAGNOSIS — R002 Palpitations: Secondary | ICD-10-CM

## 2018-03-09 DIAGNOSIS — E876 Hypokalemia: Secondary | ICD-10-CM

## 2018-03-09 LAB — URINALYSIS, COMPLETE (UACMP) WITH MICROSCOPIC
BACTERIA UA: NONE SEEN
Bilirubin Urine: NEGATIVE
Glucose, UA: NEGATIVE mg/dL
Ketones, ur: NEGATIVE mg/dL
Leukocytes, UA: NEGATIVE
Nitrite: NEGATIVE
PH: 7 (ref 5.0–8.0)
Protein, ur: NEGATIVE mg/dL
SPECIFIC GRAVITY, URINE: 1.006 (ref 1.005–1.030)

## 2018-03-09 LAB — BASIC METABOLIC PANEL
ANION GAP: 9 (ref 5–15)
BUN: 15 mg/dL (ref 6–20)
CO2: 27 mmol/L (ref 22–32)
CREATININE: 1.12 mg/dL — AB (ref 0.44–1.00)
Calcium: 9.9 mg/dL (ref 8.9–10.3)
Chloride: 103 mmol/L (ref 98–111)
GFR calc Af Amer: >60 mL/min (ref >60)
GFR, EST NON AFRICAN AMERICAN: 54 mL/min — AB (ref >60)
Glucose, Bld: 121 mg/dL — ABNORMAL HIGH (ref 70–99)
POTASSIUM: 3.2 mmol/L — AB (ref 3.5–5.1)
Sodium: 139 mmol/L (ref 135–145)

## 2018-03-09 LAB — TROPONIN I: Troponin I: <0.03 ng/mL (ref <0.03)

## 2018-03-09 MED ORDER — POTASSIUM CHLORIDE CRYS ER 20 MEQ PO TBCR
40.0000 meq | EXTENDED_RELEASE_TABLET | Freq: Once | ORAL | Status: AC
  Administered 2018-03-09: 40 meq via ORAL
  Filled 2018-03-09: qty 2

## 2018-03-09 MED ORDER — SODIUM CHLORIDE 0.9 % IV BOLUS
1000.0000 mL | Freq: Once | INTRAVENOUS | Status: AC
  Administered 2018-03-09: 1000 mL via INTRAVENOUS

## 2018-03-09 NOTE — ED Notes (Signed)
Sitting quietly with family in lobby, no acute distress noted.

## 2018-03-09 NOTE — ED Notes (Signed)
ED Provider at bedside. 

## 2018-03-09 NOTE — ED Provider Notes (Signed)
[facility] Emergency Department Provider Note   ____________________________________________   First MD Initiated Contact with Patient 03/09/18 [phone]     (approximate)  I have reviewed the triage vital signs and the nursing notes.   HISTORY  Chief Complaint Tachycardia    HPI Lorraine Franklin is a 57 y.o. female who presents to the ED from home with a chief complaint of palpitations.  Patient works at [employer] and states she exerts herself at work.  Ate a little later than usual tonight.  When she got home she felt like her heart was racing; denies irregular heartbeat.  Denies associated chest pain, shortness of breath, dizziness, abdominal pain, nausea or vomiting.  Initially had some indigestion which quickly resolved.  Has been spending some time outdoors in the heat but nothing exertional.  Denies recent travel, trauma or hormone use.   Past Medical History:  Diagnosis Date  . Asthma   . GERD (gastroesophageal reflux disease)   . Hypertension     Patient Active Problem List   Diagnosis Date Noted  . Chest pain 06/14/2017    No past surgical history on file.  Prior to Admission medications   Medication Sig Start Date End Date Taking? Authorizing Provider  esomeprazole (NEXIUM) 40 MG capsule Take 40 mg by mouth daily.    [provider]  fexofenadine (ALLEGRA) 180 MG tablet Take 180 mg by mouth daily.    [provider]  fluticasone (FLONASE) 50 MCG/ACT nasal spray Place 1 spray into both nostrils daily.    [provider]  hydrochlorothiazide (HYDRODIURIL) 25 MG tablet Take 25 mg by mouth daily.    [provider]  potassium chloride (K-DUR,KLOR-CON) 10 MEQ tablet Take 1 tablet (10 mEq total) by mouth 2 (two) times daily for 7 days. 09/06/17 09/13/17  Minna Antis, MD  ranitidine (ZANTAC) 150 MG tablet Take 1 tablet (150 mg total) by mouth 2 (two) times daily. 06/14/17 06/14/18  Auburn Bilberry, MD  simethicone  (MYLICON) 125 MG chewable tablet Chew 1 tablet (125 mg total) by mouth every 6 (six) hours as needed for flatulence. 06/14/17   Auburn Bilberry, MD  zolpidem (AMBIEN) 5 MG tablet Take 5 mg by mouth at bedtime.    [provider]    Allergies Patient has no known allergies.  Family History  Problem Relation Age of Onset  . Hypertension Mother     Social History Social History   Tobacco Use  . Smoking status: Never Smoker  . Smokeless tobacco: Never Used  Substance Use Topics  . Alcohol use: No    Frequency: Never  . Drug use: No    Review of Systems  Constitutional: No fever/chills Eyes: No visual changes. ENT: No sore throat. Cardiovascular: Positive for palpitations.  Denies chest pain. Respiratory: Denies shortness of breath. Gastrointestinal: No abdominal pain.  No nausea, no vomiting.  No diarrhea.  No constipation. Genitourinary: Negative for dysuria. Musculoskeletal: Negative for back pain. Skin: Negative for rash. Neurological: Negative for headaches, focal weakness or numbness.   ____________________________________________   PHYSICAL EXAM:  VITAL SIGNS: ED Triage Vitals  Enc Vitals Group     BP 03/08/18 2331 (!) 154/94     Pulse Rate 03/08/18 2331 (!) 119     Resp 03/08/18 2331 18     Temp 03/08/18 2331 98 F (36.7 C)     Temp Source 03/08/18 2331 Oral     SpO2 03/08/18 2331 100 %     Weight 03/08/18  2332 107 lb (48.5 kg)     Height 03/08/18 2332 5\' 3"  (1.6 m)     Head Circumference --      Peak Flow --      Pain Score 03/08/18 2332 5     Pain Loc --      Pain Edu? --      Excl. in GC? --     Constitutional: Alert and oriented. Well appearing and in no acute distress. Eyes: Conjunctivae are normal. PERRL. EOMI. Head: Atraumatic. Nose: No congestion/rhinnorhea. Mouth/Throat: Mucous membranes are moist.  Oropharynx non-erythematous. Neck: No stridor.   Cardiovascular: Tachycardic rate, regular rhythm. Grossly normal heart sounds.   Good peripheral circulation. Respiratory: Normal respiratory effort.  No retractions. Lungs CTAB. Gastrointestinal: Soft and nontender. No distention. No abdominal bruits. No CVA tenderness. Musculoskeletal: No lower extremity tenderness nor edema.  No joint effusions. Neurologic:  Normal speech and language. No gross focal neurologic deficits are appreciated. No gait instability. Skin:  Skin is warm, dry and intact. No rash noted. Psychiatric: Mood and affect are normal. Speech and behavior are normal.  ____________________________________________   LABS (all labs ordered are listed, but only abnormal results are displayed)  Labs Reviewed  BASIC METABOLIC PANEL - Abnormal; Notable for the following components:      Result Value   Potassium 3.2 (*)    Glucose, Bld 121 (*)    Creatinine, Ser 1.12 (*)    GFR calc non Af Amer 54 (*)    All other components within normal limits  URINALYSIS, COMPLETE (UACMP) WITH MICROSCOPIC - Abnormal; Notable for the following components:   Color, Urine STRAW (*)    APPearance CLEAR (*)    Hgb urine dipstick SMALL (*)    All other components within normal limits  CBC  TROPONIN I  TROPONIN I   ____________________________________________  EKG  ED ECG REPORT I, Jayquan Bradsher J, the attending physician, personally viewed and interpreted this ECG.   Date: 03/09/2018  EKG Time: 2328  Rate: 122  Rhythm: sinus tachycardia  Axis: Normal  Intervals:PVCs  ST&T Change: Nonspecific  ____________________________________________  RADIOLOGY  ED MD interpretation: No acute cardiopulmonary process  Official radiology report(s): Dg Chest 2 View  Result Date: 03/09/2018 CLINICAL DATA:  57 year old female with tachycardia. EXAM: CHEST - 2 VIEW COMPARISON:  Chest CT dated 09/07/2017 FINDINGS: The heart size and mediastinal contours are within normal limits. Both lungs are clear. The visualized skeletal structures are unremarkable. IMPRESSION: No active  cardiopulmonary disease. Electronically Signed   By: Elgie Collard M.D.   On: 03/09/2018 00:01    ____________________________________________   PROCEDURES  Procedure(s) performed: None  Procedures  Critical Care performed: No  ____________________________________________   INITIAL IMPRESSION / ASSESSMENT AND PLAN / ED COURSE  As part of my medical decision making, I reviewed the following data within the electronic MEDICAL RECORD NUMBER History obtained from family, Nursing notes reviewed and incorporated, Labs reviewed, EKG interpreted, Old chart reviewed, Radiograph reviewed and Notes from prior ED visits   57 year old female with a history of hypertension, GERD who presents with tachycardia/palpitations. Differential diagnosis includes, but is not limited to, ACS, aortic dissection, pulmonary embolism, cardiac tamponade, pneumothorax, pneumonia, pericarditis, myocarditis, GI-related causes including esophagitis/gastritis, dehydration, electrolyte abnormality, and musculoskeletal chest wall pain.    Initial lab work remarkable for mild hypokalemia and dehydration.  Will initiate IV fluid resuscitation, check orthostatic vital signs.  Will repeat timed troponin and obtain urinalysis.  Clinical Course as of Mar 09 518  Wynelle Link  Mar 09, 2018  0517 Heart rate 90.  Patient resting no acute distress.  Overall feels significantly better.  Updated her of negative repeat troponin.  Asked her about caffeine intake: States she drinks a [employer] daily.  Advised her to drink plenty of water and to stay indoors this weekend.  Strict return precautions given.  Patient and family member verbalize understanding and agree with plan of care.   [JS]  [id number] Patient takes her blood pressure medicines every morning at 8 AM.  Encouraged her to take her medicine this morning.  Orthostatics within normal limits.   [JS]    Clinical Course User Index [JS] Irean Hong, MD      ____________________________________________   FINAL CLINICAL IMPRESSION(S) / ED DIAGNOSES  Final diagnoses:  Dehydration  Sinus tachycardia  Palpitations  Hypokalemia     ED Discharge Orders    None       Note:  This document was prepared using Dragon voice recognition software and may include unintentional dictation errors.    Irean Hong, MD 03/09/18 [phone]

## 2018-03-09 NOTE — ED Notes (Signed)
Patient reports that when she arrived home after work she felt like her heart was racing. Patient reports accompanying symptom of SOB. Patient also c/o mid-abdominal pain, described as indigestion, that has since resolved. Patient denies N/V, dizziness, and lightheadedness.

## 2018-03-09 NOTE — ED Notes (Signed)
Reviewed discharge instructions and follow-up care with patient. Patient verbalized understanding of all information reviewed. Patient stable, with no distress noted at this time.    

## 2018-03-09 NOTE — ED Notes (Signed)
Updated regarding wait time, verbalized understanding.

## 2018-03-09 NOTE — Discharge Instructions (Addendum)
Stay indoors this weekend and drink plenty of fluids.  Return to the ER for recurrent or worsening symptoms, persistent vomiting, difficulty breathing or other concerns.

## 2018-06-30 ENCOUNTER — Encounter: Payer: Self-pay | Admitting: *Deleted

## 2018-06-30 ENCOUNTER — Emergency Department
Admission: EM | Admit: 2018-06-30 | Discharge: 2018-07-01 | Disposition: A | Discharge status: Home / Self Care | Payer: BLUE CROSS/BLUE SHIELD | Attending: Emergency Medicine | Admitting: Emergency Medicine

## 2018-06-30 ENCOUNTER — Other Ambulatory Visit: Payer: Self-pay

## 2018-06-30 DIAGNOSIS — I10 Essential (primary) hypertension: Secondary | ICD-10-CM | POA: Insufficient documentation

## 2018-06-30 DIAGNOSIS — E876 Hypokalemia: Secondary | ICD-10-CM | POA: Insufficient documentation

## 2018-06-30 DIAGNOSIS — Z79899 Other long term (current) drug therapy: Secondary | ICD-10-CM | POA: Insufficient documentation

## 2018-06-30 DIAGNOSIS — R002 Palpitations: Secondary | ICD-10-CM | POA: Diagnosis present

## 2018-06-30 DIAGNOSIS — J45909 Unspecified asthma, uncomplicated: Secondary | ICD-10-CM | POA: Insufficient documentation

## 2018-06-30 LAB — BASIC METABOLIC PANEL
ANION GAP: 9 (ref 5–15)
BUN: 17 mg/dL (ref 6–20)
CO2: 24 mmol/L (ref 22–32)
Calcium: 9.6 mg/dL (ref 8.9–10.3)
Chloride: 104 mmol/L (ref 98–111)
Creatinine, Ser: 0.96 mg/dL (ref 0.44–1.00)
Glucose, Bld: 142 mg/dL — ABNORMAL HIGH (ref 70–99)
POTASSIUM: 2.9 mmol/L — AB (ref 3.5–5.1)
SODIUM: 137 mmol/L (ref 135–145)

## 2018-06-30 LAB — CBC
HCT: 39 % (ref 36.0–46.0)
HEMOGLOBIN: 12.7 g/dL (ref 12.0–15.0)
MCH: 29.3 pg (ref 26.0–34.0)
MCHC: 32.6 g/dL (ref 30.0–36.0)
MCV: 90.1 fL (ref 80.0–100.0)
NRBC: 0 % (ref 0.0–0.2)
Platelets: 287 10*3/uL (ref 150–400)
RBC: 4.33 MIL/uL (ref 3.87–5.11)
RDW: 13.1 % (ref 11.5–15.5)
WBC: 5.6 10*3/uL (ref 4.0–10.5)

## 2018-06-30 MED ORDER — POTASSIUM CHLORIDE 10 MEQ/100ML IV SOLN
10.0000 meq | Freq: Once | INTRAVENOUS | Status: AC
  Administered 2018-06-30: 10 meq via INTRAVENOUS
  Filled 2018-06-30: qty 100

## 2018-06-30 MED ORDER — SODIUM CHLORIDE 0.9 % IV BOLUS
1000.0000 mL | Freq: Once | INTRAVENOUS | Status: AC
  Administered 2018-06-30: 1000 mL via INTRAVENOUS

## 2018-06-30 NOTE — ED Notes (Signed)
Introduced to patient. Placed on cardiac monitor, call bell with in reach. Initial assessment completed. Pt awaits provider for evaluation at this time.

## 2018-06-30 NOTE — Discharge Instructions (Addendum)
Take your potassium twice daily instead of once daily for the next 5 to 7 days.  Make an appointment to follow-up with a primary care doctor in the next few weeks.  Return to the ER for new, worsening, or persistent palpitations, shortness of breath, lightheadedness, or any other new or worsening symptoms that concern you.

## 2018-06-30 NOTE — ED Triage Notes (Signed)
Pt reports she was at home taking a bath and she felt her heart start to race. Pt has been seen in ED multiple times for similar events and was told her potassium dropped to cause tachycardia. No follow ups with a cardiologist. No dizziness, chest pain, SOB or lightheadedness.

## 2018-06-30 NOTE — ED Provider Notes (Signed)
[facility] Emergency Department Provider Note ____________________________________________   First MD Initiated Contact with Patient 06/30/18 2140     (approximate)  I have reviewed the triage vital signs and the nursing notes.   HISTORY  Chief Complaint Tachycardia    HPI Lorraine Franklin is a 57 y.o. female with PMH as noted below as well as a history of hypokalemia who presents with palpitations, acute onset tonight while taking a bath, and not associated with lightheadedness, chest pain, or shortness of breath.  The patient states that when she feels this way it is usually because her potassium is low.  The patient states she was never told of the reason her potassium is low but is on a potassium pill and states she is compliant.  Past Medical History:  Diagnosis Date  . Asthma   . GERD (gastroesophageal reflux disease)   . Hypertension     Patient Active Problem List   Diagnosis Date Noted  . Chest pain 06/14/2017    History reviewed. No pertinent surgical history.  Prior to Admission medications   Medication Sig Start Date End Date Taking? Authorizing Provider  esomeprazole (NEXIUM) 40 MG capsule Take 40 mg by mouth daily.    [provider]  fexofenadine (ALLEGRA) 180 MG tablet Take 180 mg by mouth daily.    [provider]  fluticasone (FLONASE) 50 MCG/ACT nasal spray Place 1 spray into both nostrils daily.    [provider]  hydrochlorothiazide (HYDRODIURIL) 25 MG tablet Take 25 mg by mouth daily.    [provider]  potassium chloride (K-DUR,KLOR-CON) 10 MEQ tablet Take 1 tablet (10 mEq total) by mouth 2 (two) times daily for 7 days. 09/06/17 09/13/17  Minna AntisPaduchowski, Kevin, MD  ranitidine (ZANTAC) 150 MG tablet Take 1 tablet (150 mg total) by mouth 2 (two) times daily. 06/14/17 06/14/18  Auburn BilberryPatel, Shreyang, MD  simethicone (MYLICON) 125 MG chewable tablet Chew 1 tablet (125 mg total) by mouth every 6 (six) hours as  needed for flatulence. 06/14/17   Auburn BilberryPatel, Shreyang, MD  zolpidem (AMBIEN) 5 MG tablet Take 5 mg by mouth at bedtime.    [provider]    Allergies Patient has no known allergies.  Family History  Problem Relation Age of Onset  . Hypertension Mother     Social History Social History   Tobacco Use  . Smoking status: Never Smoker  . Smokeless tobacco: Never Used  Substance Use Topics  . Alcohol use: No    Frequency: Never  . Drug use: No    Review of Systems  Constitutional: No fever.  No weakness. Eyes: No visual changes. ENT: No neck pain. Cardiovascular: Denies chest pain.  Positive for palpitations. Respiratory: Denies shortness of breath. Gastrointestinal: No vomiting or diarrhea.  Genitourinary: Negative for dysuria or frequency.  Musculoskeletal: Negative for myalgias or cramps. Skin: Negative for rash. Neurological: Negative for headaches, focal weakness or numbness.   ____________________________________________   PHYSICAL EXAM:  VITAL SIGNS: ED Triage Vitals  Enc Vitals Group     BP 06/30/18 2020 (!) 153/92     Pulse Rate 06/30/18 2020 (!) 119     Resp 06/30/18 2020 16     Temp 06/30/18 2020 98 F (36.7 C)     Temp Source 06/30/18 2020 Oral     SpO2 06/30/18 2020 100 %     Weight 06/30/18 2017 105 lb (47.6 kg)     Height 06/30/18 2017 5\' 3"  (1.6 m)  Head Circumference --      Peak Flow --      Pain Score 06/30/18 2017 0     Pain Loc --      Pain Edu? --      Excl. in GC? --     Constitutional: Alert and oriented. Well appearing and in no acute distress. Eyes: Conjunctivae are normal.  Head: Atraumatic. Nose: No congestion/rhinnorhea. Mouth/Throat: Mucous membranes are moist.   Neck: Normal range of motion.  Cardiovascular: Normal rate, regular rhythm. Grossly normal heart sounds.  Good peripheral circulation. Respiratory: Normal respiratory effort.  No retractions. Lungs CTAB. Gastrointestinal: No distention.  Musculoskeletal:  Extremities warm and well perfused.  Neurologic:  Normal speech and language. No gross focal neurologic deficits are appreciated.  Skin:  Skin is warm and dry. No rash noted. Psychiatric: Mood and affect are normal. Speech and behavior are normal.  ____________________________________________   LABS (all labs ordered are listed, but only abnormal results are displayed)  Labs Reviewed  BASIC METABOLIC PANEL - Abnormal; Notable for the following components:      Result Value   Potassium 2.9 (*)    Glucose, Bld 142 (*)    All other components within normal limits  CBC   ____________________________________________  EKG  ED ECG REPORT I, Dionne BucySebastian Khrystian Schauf, the attending physician, personally viewed and interpreted this ECG.  Date: 06/30/2018 EKG Time: 2016 Rate: 125 Rhythm: Sinus tachycardia QRS Axis: normal Intervals: normal ST/T Wave abnormalities: Nonspecific lateral T wave abnormalities Narrative Interpretation: no evidence of acute ischemia; no significant change when compared to EKG from 03/09/2018  ____________________________________________  RADIOLOGY    ____________________________________________   PROCEDURES  Procedure(s) performed: No  Procedures  Critical Care performed: No ____________________________________________   INITIAL IMPRESSION / ASSESSMENT AND PLAN / ED COURSE  Pertinent labs & imaging results that were available during my care of the patient were reviewed by me and considered in my medical decision making (see chart for details).  57 year old female with PMH as noted above presents with palpitations this evening similar to symptoms she has had in the past when she becomes hypokalemic.  The patient is on potassium supplementation, 10 mEq daily.  She denies other acute symptoms.  On exam the patient is well-appearing.  When she arrived she was tachycardic but this has resolved spontaneously.  She is mildly hypertensive but her other  vital signs are normal.  The remainder of the exam is unremarkable.  Her EKG shows no acute changes.  Initial lab work-up reveals recurrent mild hypokalemia.  Given the patient's history of similar symptoms with this, I will treat it.  I will give a dose of IV potassium and a liter of saline.  ----------------------------------------- 11:33 PM on 06/30/2018 -----------------------------------------  The patient continues to be asymptomatic with stable vital signs.  I instructed her to take her potassium twice daily for the next several days.  She is making arrangements to follow-up with a primary care doctor early next month.  She is stable for discharge home after she gets the potassium.  Return precautions given, and she expresses understanding.   ____________________________________________   FINAL CLINICAL IMPRESSION(S) / ED DIAGNOSES  Final diagnoses:  Hypokalemia      NEW MEDICATIONS STARTED DURING THIS VISIT:  New Prescriptions   No medications on file     Note:  This document was prepared using Dragon voice recognition software and may include unintentional dictation errors.    Dionne BucySiadecki, Jaxiel Kines, MD 06/30/18 [phone]

## 2018-07-01 NOTE — ED Notes (Signed)
Pt verbalized understanding of d/c instructions and f/u care. No further questions at this time. Pt ambulatory to the exit with steady gait.  

## 2018-11-11 IMAGING — CT CT ANGIO CHEST
2 of 6 series · 19 of 46 positions shown · IV contrast (APPLIED)
Comparison: Chest radiograph dated 06/13/2017

CLINICAL DATA: 55-year-old female with concern for PE.

EXAM:
CT ANGIOGRAPHY CHEST WITH CONTRAST
TECHNIQUE: Multidetector CT imaging of the chest was performed using the
standard protocol during bolus administration of intravenous
contrast. Multiplanar CT image reconstructions and MIPs were
obtained to evaluate the vascular anatomy.
CONTRAST:  75mL 9CYTNI-ML7 IOPAMIDOL (9CYTNI-ML7) INJECTION 76%

[Series 5: thins · axial · 0.55mm/px · z∈[-487,-235]mm · 16 of 276 slices shown]
[im 12/276  lung]
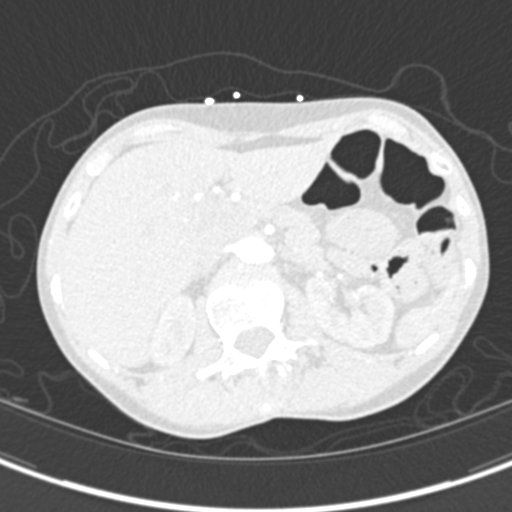
[im 36/276  soft-tissue]
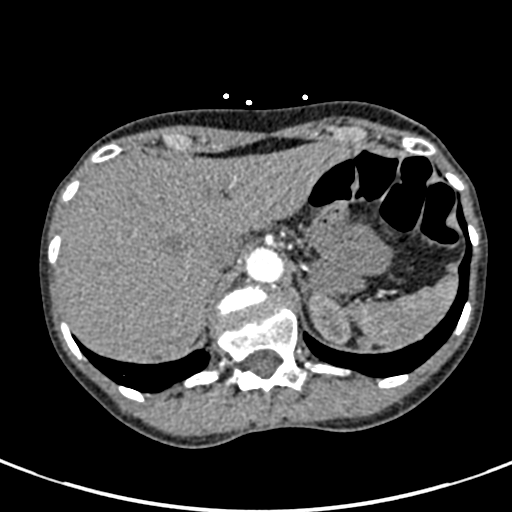
[im 48/276  lung]
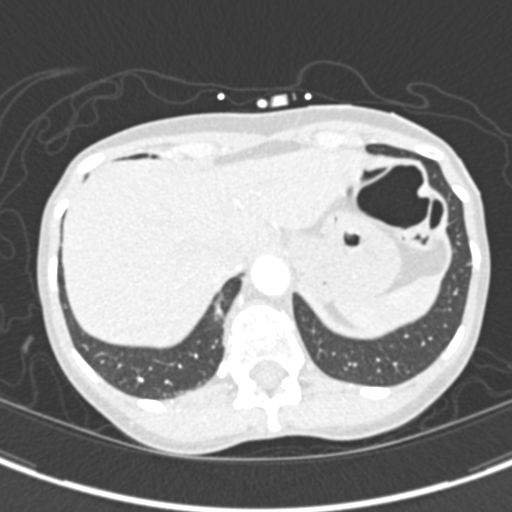
[im 60/276  soft-tissue]
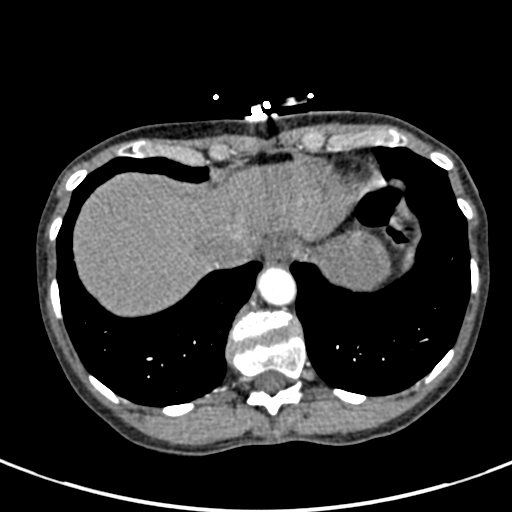
[im 84/276  lung]
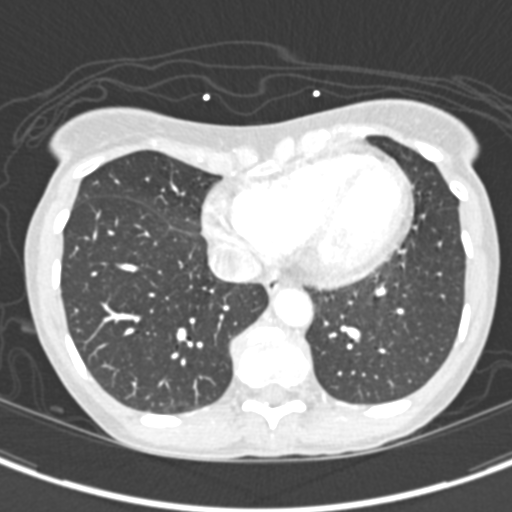
[im 96/276  soft-tissue]
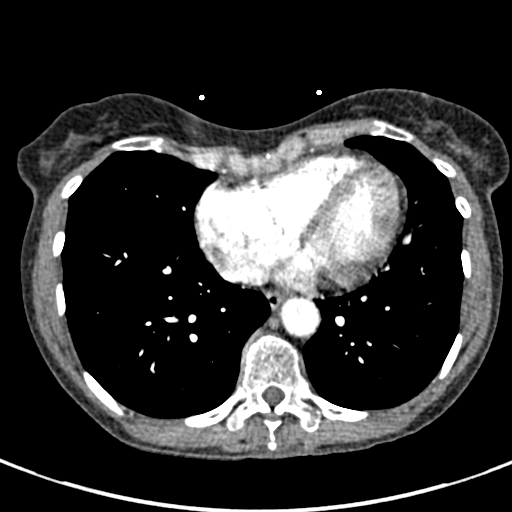
[im 108/276  lung]
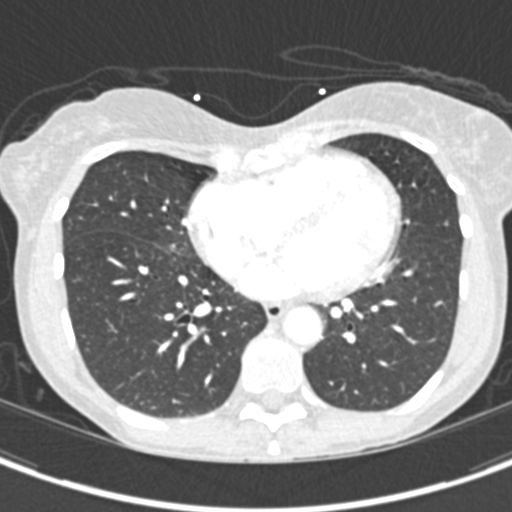
[im 132/276  soft-tissue]
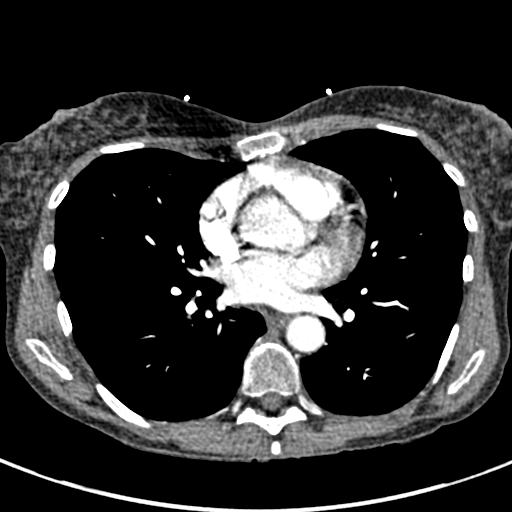
[im 144/276  lung]
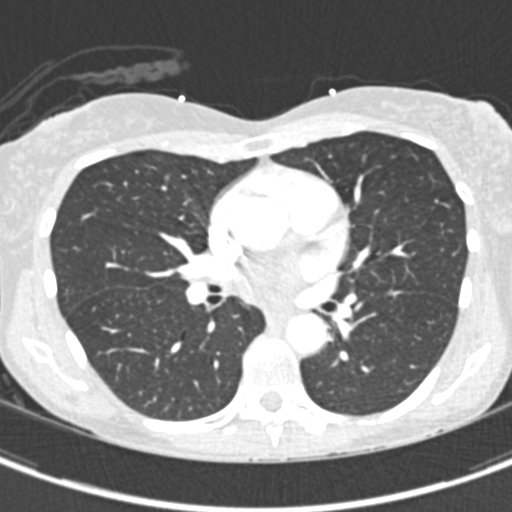
[im 168/276  soft-tissue]
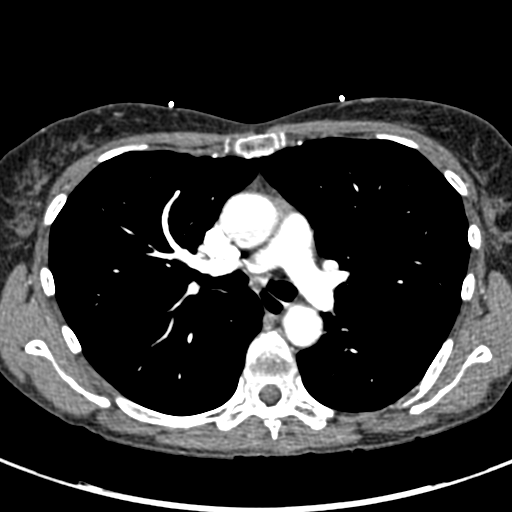
[im 180/276  lung]
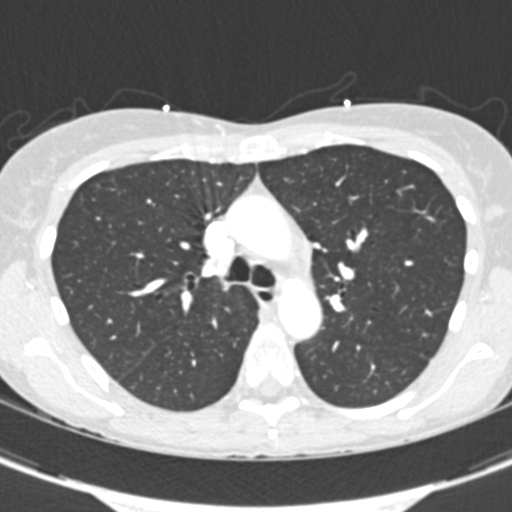
[im 192/276  soft-tissue]
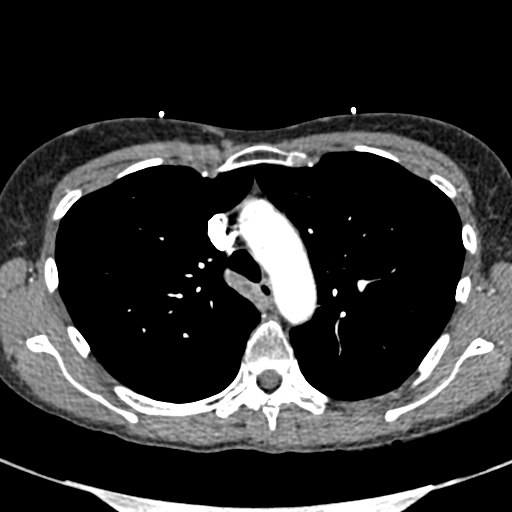
[im 216/276  lung]
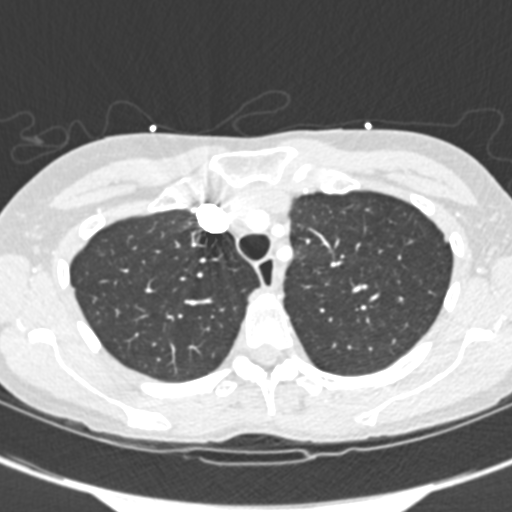
[im 228/276  soft-tissue]
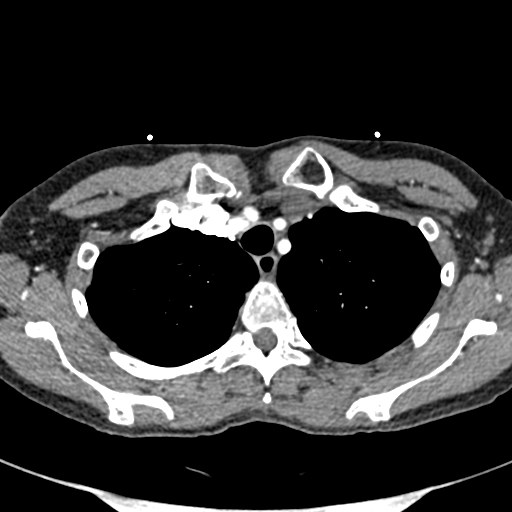
[im 240/276  lung]
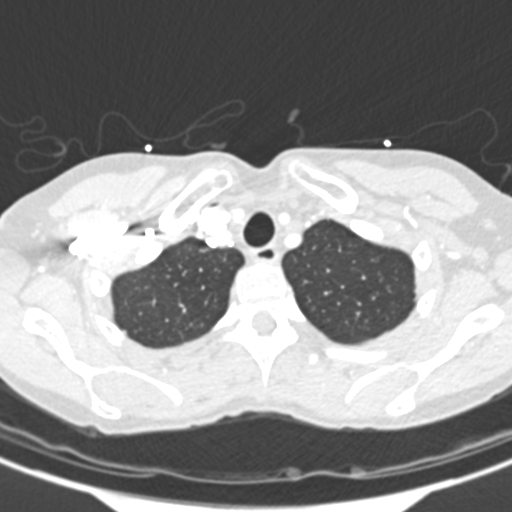
[im 264/276  soft-tissue]
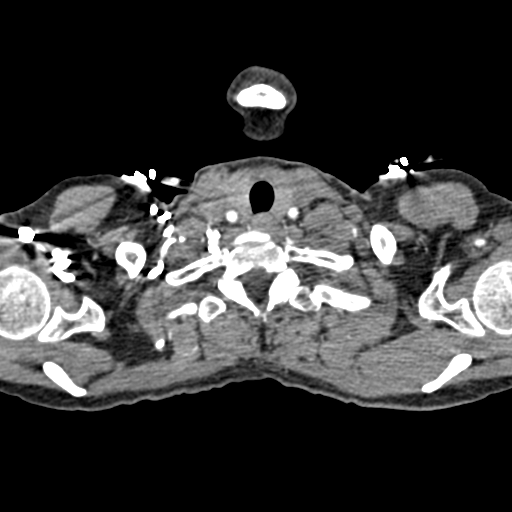

[Series 7: coronal mpr · coronal · 0.55mm/px · 3 of 65 slices shown]
[im 17/65  soft-tissue]
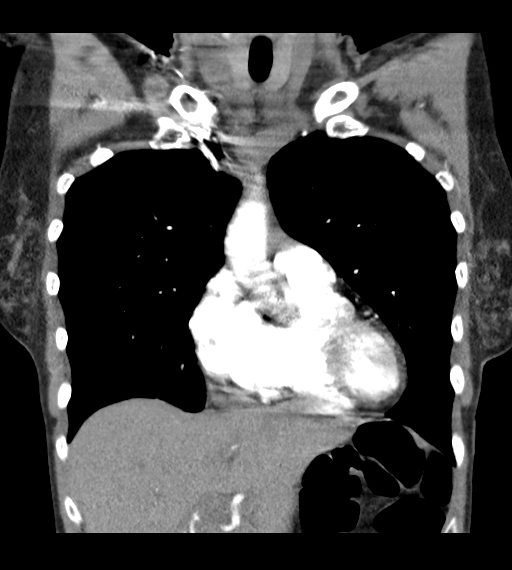
[im 33/65  soft-tissue]
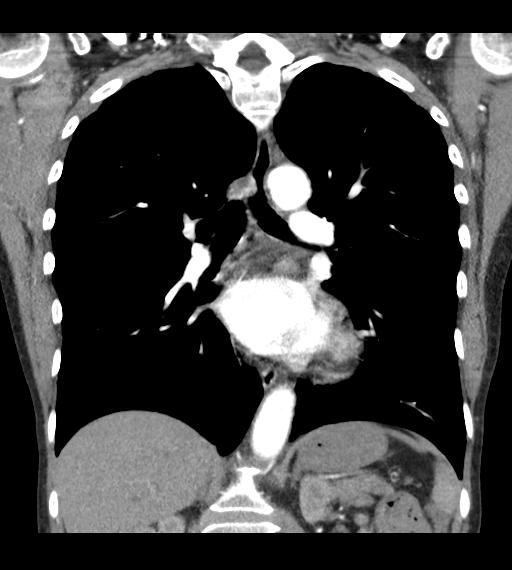
[im 49/65  soft-tissue]
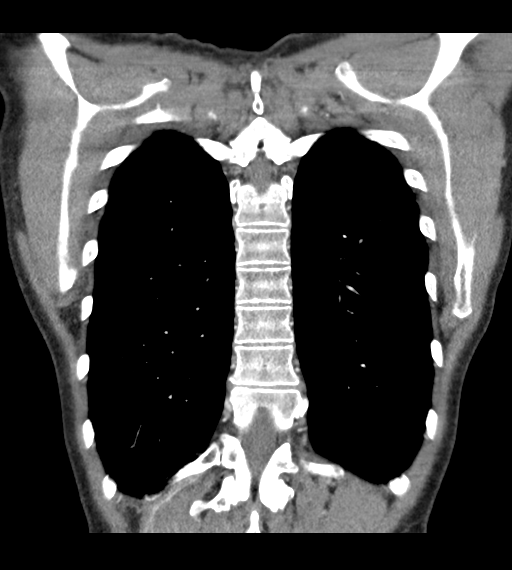

[19 of 46 positions shown; findings below may reference images not displayed]

FINDINGS: Cardiovascular: Borderline cardiac size. No pericardial effusion.
The thoracic aorta is unremarkable. The origins of the great vessels
of the aortic arch appear patent as visualized. There is no CT
evidence of pulmonary embolism.

Mediastinum/Nodes: No hilar or mediastinal adenopathy. The esophagus
and thyroid gland are grossly unremarkable. No mediastinal fluid
collection.

Lungs/Pleura: The lungs are clear. There is no pleural effusion or
pneumothorax. The central airways are patent.

Upper Abdomen: Indeterminate subcentimeter right posterior liver
subcapsular hypodense nodule. The visualized upper abdomen is
otherwise unremarkable.

Musculoskeletal: There is a pectus excavatum deformity. No acute
osseous pathology.

Review of the MIP images confirms the above findings.
IMPRESSION: No acute intrathoracic pathology. No CT evidence of pulmonary
embolism.

## 2019-10-09 ENCOUNTER — Ambulatory Visit: Payer: BLUE CROSS/BLUE SHIELD | Attending: Internal Medicine

## 2019-10-09 DIAGNOSIS — Z23 Encounter for immunization: Secondary | ICD-10-CM

## 2019-10-09 NOTE — Progress Notes (Signed)
   Covid-19 Vaccination Clinic  Name:  Lorraine Franklin    MRN: [id number] DOB: June 04, 1961  10/09/2019  Lorraine Franklin was observed post Covid-19 immunization for 15 minutes without incident. She was provided with Vaccine Information Sheet and instruction to access the V-Safe system.   Lorraine Franklin was instructed to call 911 with any severe reactions post vaccine: Marland Kitchen Difficulty breathing  . Swelling of face and throat  . A fast heartbeat  . A bad rash all over body  . Dizziness and weakness   Immunizations Administered    Name Date Dose VIS Date Route   Pfizer COVID-19 Vaccine 10/09/2019  9:44 AM 0.3 mL 06/19/2019 Intramuscular   Manufacturer: [employer], [employer]   Lot: [id number]   NDC: [address]-3

## 2019-11-03 ENCOUNTER — Ambulatory Visit: Payer: BLUE CROSS/BLUE SHIELD | Attending: Internal Medicine

## 2019-11-03 DIAGNOSIS — Z23 Encounter for immunization: Secondary | ICD-10-CM

## 2019-11-03 NOTE — Progress Notes (Signed)
   Covid-19 Vaccination Clinic  Name:  Lorraine Franklin    MRN: [id number] DOB: 08-Apr-1961  11/03/2019  Ms. Lorraine Franklin was observed post Covid-19 immunization for 15 minutes without incident. She was provided with Vaccine Information Sheet and instruction to access the V-Safe system.   Ms. Lorraine Franklin was instructed to call 911 with any severe reactions post vaccine: Marland Kitchen Difficulty breathing  . Swelling of face and throat  . A fast heartbeat  . A bad rash all over body  . Dizziness and weakness   Immunizations Administered    Name Date Dose VIS Date Route   Pfizer COVID-19 Vaccine 11/03/2019 11:49 AM 0.3 mL 09/02/2018 Intramuscular   Manufacturer: [employer], [employer]   Lot: [id number]   NDC: [address]-7

## 2022-11-23 ENCOUNTER — Other Ambulatory Visit: Payer: Self-pay | Admitting: Internal Medicine

## 2022-11-23 DIAGNOSIS — Z1231 Encounter for screening mammogram for malignant neoplasm of breast: Secondary | ICD-10-CM

## 2023-09-06 ENCOUNTER — Encounter: Payer: Self-pay | Admitting: Obstetrics and Gynecology

## 2023-09-09 ENCOUNTER — Encounter: Payer: Self-pay | Admitting: *Deleted

## 2024-02-12 ENCOUNTER — Ambulatory Visit: Admitting: Cardiology

## 2024-02-12 ENCOUNTER — Encounter: Payer: Self-pay | Admitting: Cardiology

## 2024-02-12 VITALS — BP 125/75 | HR 77 | Ht 63.0 in | Wt 111.2 lb

## 2024-02-12 DIAGNOSIS — I10 Essential (primary) hypertension: Secondary | ICD-10-CM

## 2024-02-12 DIAGNOSIS — Z7689 Persons encountering health services in other specified circumstances: Secondary | ICD-10-CM | POA: Diagnosis not present

## 2024-02-12 DIAGNOSIS — K219 Gastro-esophageal reflux disease without esophagitis: Secondary | ICD-10-CM | POA: Diagnosis not present

## 2024-02-12 NOTE — Progress Notes (Addendum)
 New Patient Office Visit  Subjective   Patient ID: Lorraine Franklin, female    DOB: Jan 05, 1961  Age: 63 y.o. MRN: [id number]  CC:  Chief Complaint  Patient presents with   Follow-up    HPI Lorraine Franklin presents to establish care Previous Primary Care provider/office:   she does not have additional concerns to discuss today.   Patient in office to establish care. Patient doing well, no complaints. Discussed colon cancer screening, patient declines at this time. Due for mammogram, patient declines.  Patient take Ambien  nightly for sleep. States she has taken magnesium  and melatonin with no help sleeping.  Continue same medications.     Outpatient Encounter Medications as of 02/12/2024  Medication Sig   fluticasone  (FLONASE ) 50 MCG/ACT nasal spray Place 1 spray into both nostrils daily.   zolpidem  (AMBIEN ) 5 MG tablet Take 5 mg by mouth at bedtime.   [DISCONTINUED] amLODipine  (NORVASC ) 10 MG tablet Take 10 mg by mouth daily.   [DISCONTINUED] levocetirizine (XYZAL ) 5 MG tablet Take 5 mg by mouth every evening.   [DISCONTINUED] montelukast  (SINGULAIR ) 10 MG tablet Take 10 mg by mouth at bedtime.   [DISCONTINUED] omeprazole  (PRILOSEC) 20 MG capsule Take 20 mg by mouth daily.   [DISCONTINUED] esomeprazole (NEXIUM) 40 MG capsule Take 40 mg by mouth daily. (Patient not taking: Reported on 02/12/2024)   [DISCONTINUED] fexofenadine (ALLEGRA) 180 MG tablet Take 180 mg by mouth daily. (Patient not taking: Reported on 02/12/2024)   [DISCONTINUED] hydrochlorothiazide (HYDRODIURIL) 25 MG tablet Take 25 mg by mouth daily. (Patient not taking: Reported on 02/12/2024)   [DISCONTINUED] potassium chloride  (K-DUR,KLOR-CON ) 10 MEQ tablet Take 1 tablet (10 mEq total) by mouth 2 (two) times daily for 7 days. (Patient not taking: Reported on 02/12/2024)   [DISCONTINUED] ranitidine  (ZANTAC ) 150 MG tablet Take 1 tablet (150 mg total) by mouth 2 (two) times daily. (Patient not taking: Reported on 02/12/2024)    [DISCONTINUED] simethicone  (MYLICON) 125 MG chewable tablet Chew 1 tablet (125 mg total) by mouth every 6 (six) hours as needed for flatulence. (Patient not taking: Reported on 02/12/2024)   No facility-administered encounter medications on file as of 02/12/2024.    Past Medical History:  Diagnosis Date   Asthma    GERD (gastroesophageal reflux disease)    Hypertension     History reviewed. No pertinent surgical history.  Family History  Problem Relation Age of Onset   Hypertension Mother     Social History   Socioeconomic History   Marital status: Widowed    Spouse name: Not on file   Number of children: Not on file   Years of education: Not on file   Highest education level: Not on file  Occupational History   Not on file  Tobacco Use   Smoking status: Never   Smokeless tobacco: Never  Substance and Sexual Activity   Alcohol use: No   Drug use: No   Sexual activity: Not on file  Other Topics Concern   Not on file  Social History Narrative   Not on file   Social Drivers of Health   Financial Resource Strain: Medium Risk (12/10/2023)   Received from [facility]   Overall Financial Resource Strain (CARDIA)    Difficulty of Paying Living Expenses: Somewhat hard  Food Insecurity: Food Insecurity Present (12/10/2023)   Received from [facility]   Hunger Vital Sign    Within the past 12 months, you worried that your food would run out before  you got the money to buy more.: Sometimes true    Within the past 12 months, the food you bought just didn't last and you didn't have money to get more.: Sometimes true  Transportation Needs: Unmet Transportation Needs (12/10/2023)   Received from [facility] - Transportation    In the past 12 months, has lack of transportation kept you from medical appointments or from getting medications?: Yes    Lack of Transportation (Non-Medical): Yes  Physical Activity: Not on file   Stress: Not on file  Social Connections: Not on file  Intimate Partner Violence: Not on file    Review of Systems  Constitutional: Negative.   HENT: Negative.    Eyes: Negative.   Respiratory: Negative.  Negative for shortness of breath.   Cardiovascular: Negative.  Negative for chest pain.  Gastrointestinal: Negative.  Negative for abdominal pain, constipation and diarrhea.  Genitourinary: Negative.   Musculoskeletal:  Negative for joint pain and myalgias.  Skin: Negative.   Neurological: Negative.  Negative for dizziness and headaches.  Endo/Heme/Allergies: Negative.   All other systems reviewed and are negative.       Objective   BP 125/75   Pulse 77   Ht 5' 3 (1.6 m)   Wt 111 lb 3.2 oz (50.4 kg)   SpO2 99%   BMI 19.70 kg/m   Physical Exam Vitals and nursing note reviewed.  Constitutional:      Appearance: Normal appearance. She is normal weight.  HENT:     Head: Normocephalic and atraumatic.     Nose: Nose normal.     Mouth/Throat:     Mouth: Mucous membranes are moist.  Eyes:     Extraocular Movements: Extraocular movements intact.     Conjunctiva/sclera: Conjunctivae normal.     Pupils: Pupils are equal, round, and reactive to light.  Cardiovascular:     Rate and Rhythm: Normal rate and regular rhythm.     Pulses: Normal pulses.     Heart sounds: Normal heart sounds.  Pulmonary:     Effort: Pulmonary effort is normal.     Breath sounds: Normal breath sounds.  Abdominal:     General: Abdomen is flat. Bowel sounds are normal.     Palpations: Abdomen is soft.  Musculoskeletal:        General: Normal range of motion.     Cervical back: Normal range of motion.  Skin:    General: Skin is warm and dry.  Neurological:     General: No focal deficit present.     Mental Status: She is alert and oriented to person, place, and time.  Psychiatric:        Mood and Affect: Mood normal.        Behavior: Behavior normal.        Thought Content: Thought  content normal.        Judgment: Judgment normal.        Assessment & Plan:  Continue same medications.   Problem List Items Addressed This Visit       Cardiovascular and Mediastinum   Essential hypertension - Primary     Digestive   Esophageal reflux     Other   Encounter to establish care    Return in about 3 months (around 05/14/2024) for fasting labs prior.   Total time spent: 25 minutes  [employer], NP  02/12/2024   This document may have been prepared by [employer] Voice Recognition software and as  such may include unintentional dictation errors.

## 2024-03-02 ENCOUNTER — Other Ambulatory Visit: Payer: Self-pay

## 2024-03-02 MED ORDER — AMLODIPINE BESYLATE 10 MG PO TABS: 10.0000 mg | ORAL_TABLET | Freq: Every day | ORAL | 0 refills | Status: DC

## 2024-03-02 MED ORDER — MONTELUKAST SODIUM 10 MG PO TABS: 10.0000 mg | ORAL_TABLET | Freq: Every day | ORAL | 0 refills | Status: DC

## 2024-03-02 MED ORDER — LEVOCETIRIZINE DIHYDROCHLORIDE 5 MG PO TABS: 5.0000 mg | ORAL_TABLET | Freq: Every evening | ORAL | 0 refills | Status: DC

## 2024-03-02 MED ORDER — OMEPRAZOLE 20 MG PO CPDR: 20.0000 mg | DELAYED_RELEASE_CAPSULE | Freq: Every day | ORAL | 0 refills | Status: DC

## 2024-03-06 ENCOUNTER — Telehealth: Payer: Self-pay

## 2024-03-06 MED ORDER — ZOLPIDEM TARTRATE 5 MG PO TABS: 5.0000 mg | ORAL_TABLET | Freq: Every day | ORAL | 2 refills | Status: DC

## 2024-03-06 NOTE — Telephone Encounter (Signed)
 Patient called asking about her ambien  being sent in, can you check the task that was forwarded to you the other day about this, Neelam asked for you to mention it in your office note if you talked about it

## 2024-03-10 ENCOUNTER — Other Ambulatory Visit: Payer: Self-pay

## 2024-04-14 ENCOUNTER — Other Ambulatory Visit: Payer: Self-pay

## 2024-05-15 ENCOUNTER — Ambulatory Visit: Admitting: Cardiology

## 2024-05-19 ENCOUNTER — Other Ambulatory Visit

## 2024-05-19 ENCOUNTER — Other Ambulatory Visit: Payer: Self-pay | Admitting: Cardiology

## 2024-05-19 DIAGNOSIS — Z13220 Encounter for screening for lipoid disorders: Secondary | ICD-10-CM

## 2024-05-19 DIAGNOSIS — I10 Essential (primary) hypertension: Secondary | ICD-10-CM

## 2024-05-19 DIAGNOSIS — Z1329 Encounter for screening for other suspected endocrine disorder: Secondary | ICD-10-CM

## 2024-05-19 DIAGNOSIS — Z131 Encounter for screening for diabetes mellitus: Secondary | ICD-10-CM

## 2024-05-20 ENCOUNTER — Ambulatory Visit: Payer: Self-pay | Admitting: Cardiology

## 2024-05-20 LAB — LIPID PANEL
Chol/HDL Ratio: 3.8 ratio (ref 0.0–4.4)
Cholesterol, Total: 214 mg/dL — ABNORMAL HIGH (ref 100–199)
HDL: 57 mg/dL (ref >39)
LDL Chol Calc (NIH): 129 mg/dL — ABNORMAL HIGH (ref 0–99)
Triglycerides: 159 mg/dL — ABNORMAL HIGH (ref 0–149)
VLDL Cholesterol Cal: 28 mg/dL (ref 5–40)

## 2024-05-20 LAB — CMP14+EGFR
ALT: 10 IU/L (ref 0–32)
AST: 19 IU/L (ref 0–40)
Albumin: 4.3 g/dL (ref 3.9–4.9)
Alkaline Phosphatase: 84 IU/L (ref 49–135)
BUN/Creatinine Ratio: 13 (ref 12–28)
BUN: 11 mg/dL (ref 8–27)
Bilirubin Total: 0.2 mg/dL (ref 0.0–1.2)
CO2: 26 mmol/L (ref 20–29)
Calcium: 9.7 mg/dL (ref 8.7–10.3)
Chloride: 101 mmol/L (ref 96–106)
Creatinine, Ser: 0.86 mg/dL (ref 0.57–1.00)
Globulin, Total: 3.6 g/dL (ref 1.5–4.5)
Glucose: 117 mg/dL — ABNORMAL HIGH (ref 70–99)
Potassium: 3.4 mmol/L — ABNORMAL LOW (ref 3.5–5.2)
Sodium: 137 mmol/L (ref 134–144)
Total Protein: 7.9 g/dL (ref 6.0–8.5)
eGFR: 77 mL/min/1.73 (ref >59)

## 2024-05-20 LAB — TSH: TSH: 1.73 u[IU]/mL (ref 0.450–4.500)

## 2024-05-20 LAB — HEMOGLOBIN A1C
Est. average glucose Bld gHb Est-mCnc: 117 mg/dL
Hgb A1c MFr Bld: 5.7 % — ABNORMAL HIGH (ref 4.8–5.6)

## 2024-05-21 ENCOUNTER — Encounter: Payer: Self-pay | Admitting: Cardiology

## 2024-05-21 ENCOUNTER — Ambulatory Visit: Admitting: Cardiology

## 2024-05-21 VITALS — BP 122/70 | HR 76 | Ht 63.0 in | Wt 110.8 lb

## 2024-05-21 DIAGNOSIS — K219 Gastro-esophageal reflux disease without esophagitis: Secondary | ICD-10-CM | POA: Diagnosis not present

## 2024-05-21 DIAGNOSIS — I10 Essential (primary) hypertension: Secondary | ICD-10-CM

## 2024-05-21 DIAGNOSIS — F5104 Psychophysiologic insomnia: Secondary | ICD-10-CM | POA: Diagnosis not present

## 2024-05-21 DIAGNOSIS — R7303 Prediabetes: Secondary | ICD-10-CM | POA: Insufficient documentation

## 2024-05-21 NOTE — Progress Notes (Signed)
 Established Patient Office Visit  Subjective:  Patient ID: Floye Fesler, female    DOB: 05-10-1961  Age: 63 y.o. MRN: [id number]  Chief Complaint  Patient presents with   Results    3 month lab results    Patient in office for 3 month follow up, discuss recent lab results. Patient doing well, no new complaints today. Blood pressure improved on recheck.  GERD controlled on omeprazole .  Discussed recent lab results. Hgb A1c improved. LDL elevated, mediterranean diet handout given to patient. Potassium slightly low, recommend increasing potassium intake.  Patient takes Ambien  for chronic insomnia. States she has taken magnesium  and melatonin with no help sleeping.  Discussed colon cancer screening, patient declines at this time. Due for mammogram, patient declines. Continue same medications.     No other concerns at this time.   Past Medical History:  Diagnosis Date   Asthma    GERD (gastroesophageal reflux disease)    Hypertension     History reviewed. No pertinent surgical history.  Social History   Socioeconomic History   Marital status: Widowed    Spouse name: Not on file   Number of children: Not on file   Years of education: Not on file   Highest education level: Not on file  Occupational History   Not on file  Tobacco Use   Smoking status: Never   Smokeless tobacco: Never  Substance and Sexual Activity   Alcohol use: No   Drug use: No   Sexual activity: Not on file  Other Topics Concern   Not on file  Social History Narrative   Not on file   Social Drivers of Health   Financial Resource Strain: Medium Risk (12/10/2023)   Received from [facility]   Overall Financial Resource Strain (CARDIA)    Difficulty of Paying Living Expenses: Somewhat hard  Food Insecurity: Food Insecurity Present (12/10/2023)   Received from [facility]   Hunger Vital Sign    Within the past 12 months, you worried that your food would run  out before you got the money to buy more.: Sometimes true    Within the past 12 months, the food you bought just didn't last and you didn't have money to get more.: Sometimes true  Transportation Needs: Unmet Transportation Needs (12/10/2023)   Received from [facility] - Transportation    In the past 12 months, has lack of transportation kept you from medical appointments or from getting medications?: Yes    Lack of Transportation (Non-Medical): Yes  Physical Activity: Not on file  Stress: Not on file  Social Connections: Not on file  Intimate Partner Violence: Not on file    Family History  Problem Relation Age of Onset   Hypertension Mother     No Known Allergies  Outpatient Medications Prior to Visit  Medication Sig   amLODipine  (NORVASC ) 10 MG tablet Take 1 tablet (10 mg total) by mouth daily.   azelastine (OPTIVAR) 0.05 % ophthalmic solution INSTILL 1 DROP INTO EACH EYE TWICE DAILY FOR 10 DAYS, THEN USE AS NEEDED   cycloSPORINE (RESTASIS) 0.05 % ophthalmic emulsion    levocetirizine (XYZAL ) 5 MG tablet Take 1 tablet (5 mg total) by mouth every evening.   montelukast  (SINGULAIR ) 10 MG tablet Take 1 tablet (10 mg total) by mouth at bedtime.   omeprazole  (PRILOSEC) 20 MG capsule Take 1 capsule (20 mg total) by mouth daily.   prednisoLONE acetate (PRED FORTE) 1 %  ophthalmic suspension SMARTSIG:In Eye(s)   sodium chloride  (OCEAN) 0.65 % nasal spray Place 1 spray into the nose.   timolol (TIMOPTIC) 0.5 % ophthalmic solution SMARTSIG:In Eye(s)   XIIDRA 5 % SOLN    zolpidem  (AMBIEN ) 5 MG tablet Take 1 tablet (5 mg total) by mouth at bedtime.   fluticasone  (FLONASE ) 50 MCG/ACT nasal spray Place 1 spray into both nostrils daily. (Patient not taking: Reported on 05/21/2024)   No facility-administered medications prior to visit.    Review of Systems  Constitutional: Negative.   HENT: Negative.    Eyes: Negative.   Respiratory: Negative.  Negative for  shortness of breath.   Cardiovascular: Negative.  Negative for chest pain.  Gastrointestinal: Negative.  Negative for abdominal pain, constipation and diarrhea.  Genitourinary: Negative.   Musculoskeletal:  Negative for joint pain and myalgias.  Skin: Negative.   Neurological: Negative.  Negative for dizziness and headaches.  Endo/Heme/Allergies: Negative.   All other systems reviewed and are negative.      Objective:   BP 122/70 (BP Location: Right Arm, Patient Position: Sitting, Cuff Size: Small)   Pulse 76   Ht 5' 3 (1.6 m)   Wt 110 lb 12.8 oz (50.3 kg)   SpO2 100%   BMI 19.63 kg/m   Vitals:   05/21/24 0948 05/21/24 1013  BP: (!) 132/98 122/70  Pulse: 76   Height: 5' 3 (1.6 m)   Weight: 110 lb 12.8 oz (50.3 kg)   SpO2: 100%   BMI (Calculated): 19.63     Physical Exam Vitals and nursing note reviewed.  Constitutional:      Appearance: Normal appearance. She is normal weight.  HENT:     Head: Normocephalic and atraumatic.     Nose: Nose normal.     Mouth/Throat:     Mouth: Mucous membranes are moist.  Eyes:     Extraocular Movements: Extraocular movements intact.     Conjunctiva/sclera: Conjunctivae normal.     Pupils: Pupils are equal, round, and reactive to light.  Cardiovascular:     Rate and Rhythm: Normal rate and regular rhythm.     Pulses: Normal pulses.     Heart sounds: Normal heart sounds.  Pulmonary:     Effort: Pulmonary effort is normal.     Breath sounds: Normal breath sounds.  Abdominal:     General: Abdomen is flat. Bowel sounds are normal.     Palpations: Abdomen is soft.  Musculoskeletal:        General: Normal range of motion.     Cervical back: Normal range of motion.  Skin:    General: Skin is warm and dry.  Neurological:     General: No focal deficit present.     Mental Status: She is alert and oriented to person, place, and time.  Psychiatric:        Mood and Affect: Mood normal.        Behavior: Behavior normal.         Thought Content: Thought content normal.        Judgment: Judgment normal.      No results found for any visits on 05/21/24.  Recent Results (from the past 2160 hours)  CMP14+EGFR     Status: Abnormal   Collection Time: 05/19/24  3:26 PM  Result Value Ref Range   Glucose 117 (H) 70 - 99 mg/dL   BUN 11 8 - 27 mg/dL   Creatinine, Ser 9.13 0.57 - 1.00 mg/dL   eGFR 77 >  59 mL/min/1.73   BUN/Creatinine Ratio 13 12 - 28   Sodium 137 134 - 144 mmol/L   Potassium 3.4 (L) 3.5 - 5.2 mmol/L   Chloride 101 96 - 106 mmol/L   CO2 26 20 - 29 mmol/L   Calcium 9.7 8.7 - 10.3 mg/dL   Total Protein 7.9 6.0 - 8.5 g/dL   Albumin 4.3 3.9 - 4.9 g/dL   Globulin, Total 3.6 1.5 - 4.5 g/dL   Bilirubin Total 0.2 0.0 - 1.2 mg/dL   Alkaline Phosphatase 84 49 - 135 IU/L   AST 19 0 - 40 IU/L   ALT 10 0 - 32 IU/L  Lipid Profile     Status: Abnormal   Collection Time: 05/19/24  3:26 PM  Result Value Ref Range   Cholesterol, Total 214 (H) 100 - 199 mg/dL   Triglycerides 840 (H) 0 - 149 mg/dL   HDL 57 >60 mg/dL   VLDL Cholesterol Cal 28 5 - 40 mg/dL   LDL Chol Calc (NIH) 870 (H) 0 - 99 mg/dL   Chol/HDL Ratio 3.8 0.0 - 4.4 ratio    Comment:                                   T. Chol/HDL Ratio                                             Men  Women                               1/2 Avg.Risk  3.4    3.3                                   Avg.Risk  5.0    4.4                                2X Avg.Risk  9.6    7.1                                3X Avg.Risk 23.4   11.0   TSH     Status: None   Collection Time: 05/19/24  3:26 PM  Result Value Ref Range   TSH 1.730 0.450 - 4.500 uIU/mL  Hemoglobin A1c     Status: Abnormal   Collection Time: 05/19/24  3:26 PM  Result Value Ref Range   Hgb A1c MFr Bld 5.7 (H) 4.8 - 5.6 %    Comment:          Prediabetes: 5.7 - 6.4          Diabetes: >6.4          Glycemic control for adults with diabetes: <7.0    Est. average glucose Bld gHb Est-mCnc 117 mg/dL       Assessment & Plan:  Mediterranean diet to lower LDL Increase potassium intake Continue current medications  Problem List Items Addressed This Visit       Cardiovascular and Mediastinum   Essential hypertension - Primary     Digestive  Esophageal reflux     Other   Chronic insomnia   Prediabetes    Return in about 4 months (around 09/18/2024) for fasting lab work prior.   Total time spent: 25 minutes. This time includes review of previous notes and results and patient face to face interaction during today's visit.    Jeoffrey Pollen, NP  05/21/2024   This document may have been prepared by Dragon Voice Recognition software and as such may include unintentional dictation errors.

## 2024-06-07 ENCOUNTER — Other Ambulatory Visit: Payer: Self-pay | Admitting: Cardiology

## 2024-06-10 ENCOUNTER — Other Ambulatory Visit: Payer: Self-pay | Admitting: Internal Medicine

## 2024-06-14 ENCOUNTER — Other Ambulatory Visit: Payer: Self-pay | Admitting: Cardiology

## 2024-07-07 ENCOUNTER — Other Ambulatory Visit: Payer: Self-pay | Admitting: Internal Medicine

## 2024-07-08 ENCOUNTER — Other Ambulatory Visit: Payer: Self-pay

## 2024-07-11 ENCOUNTER — Other Ambulatory Visit: Payer: Self-pay | Admitting: Internal Medicine

## 2024-07-13 ENCOUNTER — Other Ambulatory Visit: Payer: Self-pay

## 2024-08-12 ENCOUNTER — Other Ambulatory Visit: Payer: Self-pay | Admitting: Internal Medicine

## 2024-08-14 ENCOUNTER — Other Ambulatory Visit: Payer: Self-pay

## 2024-08-14 ENCOUNTER — Telehealth: Payer: Self-pay

## 2024-08-14 NOTE — Telephone Encounter (Signed)
 Patient LM asking for call back about an urgent medication request but didn't state what medication she is needing,  please call the patient back to find out what medication she's talking about

## 2024-09-18 ENCOUNTER — Ambulatory Visit: Admitting: Cardiology
